# Patient Record
Sex: Female | Born: 1995 | Race: White | Hispanic: No | Marital: Married | Smoking: Never smoker
Health system: Southern US, Community
[De-identification: ages and names within clinical notes are randomized; demographics above are authoritative.]

## PROBLEM LIST (undated history)

## (undated) ENCOUNTER — Inpatient Hospital Stay (HOSPITAL_COMMUNITY): Payer: Self-pay

## (undated) DIAGNOSIS — K920 Hematemesis: Secondary | ICD-10-CM

## (undated) DIAGNOSIS — O210 Mild hyperemesis gravidarum: Secondary | ICD-10-CM

## (undated) DIAGNOSIS — J069 Acute upper respiratory infection, unspecified: Secondary | ICD-10-CM

## (undated) DIAGNOSIS — R0981 Nasal congestion: Secondary | ICD-10-CM

## (undated) DIAGNOSIS — O99891 Other specified diseases and conditions complicating pregnancy: Secondary | ICD-10-CM

## (undated) DIAGNOSIS — R000 Tachycardia, unspecified: Secondary | ICD-10-CM

## (undated) DIAGNOSIS — O99210 Obesity complicating pregnancy, unspecified trimester: Secondary | ICD-10-CM

## (undated) DIAGNOSIS — J039 Acute tonsillitis, unspecified: Secondary | ICD-10-CM

## (undated) DIAGNOSIS — E282 Polycystic ovarian syndrome: Secondary | ICD-10-CM

## (undated) DIAGNOSIS — K219 Gastro-esophageal reflux disease without esophagitis: Secondary | ICD-10-CM

## (undated) DIAGNOSIS — R42 Dizziness and giddiness: Secondary | ICD-10-CM

## (undated) DIAGNOSIS — R7989 Other specified abnormal findings of blood chemistry: Secondary | ICD-10-CM

## (undated) DIAGNOSIS — O9989 Other specified diseases and conditions complicating pregnancy, childbirth and the puerperium: Secondary | ICD-10-CM

## (undated) HISTORY — DX: Mild hyperemesis gravidarum: O21.0

## (undated) HISTORY — DX: Hematemesis: K92.0

## (undated) HISTORY — DX: Other specified diseases and conditions complicating pregnancy, childbirth and the puerperium: O99.89

## (undated) HISTORY — DX: Other specified diseases and conditions complicating pregnancy: O99.891

## (undated) HISTORY — DX: Gastro-esophageal reflux disease without esophagitis: K21.9

## (undated) HISTORY — DX: Polycystic ovarian syndrome: E28.2

## (undated) HISTORY — DX: Other specified abnormal findings of blood chemistry: R79.89

## (undated) HISTORY — DX: Tachycardia, unspecified: R00.0

## (undated) HISTORY — DX: Dizziness and giddiness: R42

## (undated) HISTORY — DX: Acute upper respiratory infection, unspecified: J06.9

## (undated) HISTORY — DX: Nasal congestion: R09.81

## (undated) HISTORY — DX: Obesity complicating pregnancy, unspecified trimester: O99.210

---
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
## 2016-03-04 DIAGNOSIS — E282 Polycystic ovarian syndrome: Secondary | ICD-10-CM | POA: Insufficient documentation

## 2016-03-19 ENCOUNTER — Encounter (HOSPITAL_BASED_OUTPATIENT_CLINIC_OR_DEPARTMENT_OTHER): Payer: Self-pay

## 2016-03-19 ENCOUNTER — Emergency Department (HOSPITAL_BASED_OUTPATIENT_CLINIC_OR_DEPARTMENT_OTHER)
Admission: EM | Admit: 2016-03-19 | Discharge: 2016-03-19 | Disposition: A | Discharge status: Home / Self Care | Payer: BLUE CROSS/BLUE SHIELD | Attending: Emergency Medicine | Admitting: Emergency Medicine

## 2016-03-19 ENCOUNTER — Emergency Department (HOSPITAL_BASED_OUTPATIENT_CLINIC_OR_DEPARTMENT_OTHER): Payer: BLUE CROSS/BLUE SHIELD

## 2016-03-19 DIAGNOSIS — Y939 Activity, unspecified: Secondary | ICD-10-CM | POA: Insufficient documentation

## 2016-03-19 DIAGNOSIS — Z7984 Long term (current) use of oral hypoglycemic drugs: Secondary | ICD-10-CM | POA: Diagnosis not present

## 2016-03-19 DIAGNOSIS — Y999 Unspecified external cause status: Secondary | ICD-10-CM | POA: Diagnosis not present

## 2016-03-19 DIAGNOSIS — S0990XA Unspecified injury of head, initial encounter: Secondary | ICD-10-CM | POA: Diagnosis present

## 2016-03-19 DIAGNOSIS — Z79899 Other long term (current) drug therapy: Secondary | ICD-10-CM | POA: Diagnosis not present

## 2016-03-19 DIAGNOSIS — W108XXA Fall (on) (from) other stairs and steps, initial encounter: Secondary | ICD-10-CM | POA: Insufficient documentation

## 2016-03-19 DIAGNOSIS — Y929 Unspecified place or not applicable: Secondary | ICD-10-CM | POA: Insufficient documentation

## 2016-03-19 MED ORDER — IBUPROFEN 400 MG PO TABS
600.0000 mg | ORAL_TABLET | Freq: Once | ORAL | Status: AC
  Administered 2016-03-19: 600 mg via ORAL
  Filled 2016-03-19: qty 1

## 2016-03-19 MED ORDER — METOCLOPRAMIDE HCL 10 MG PO TABS
10.0000 mg | ORAL_TABLET | Freq: Once | ORAL | Status: AC
  Administered 2016-03-19: 10 mg via ORAL
  Filled 2016-03-19: qty 1

## 2016-03-19 MED ORDER — IBUPROFEN 600 MG PO TABS: 600.0000 mg | ORAL_TABLET | Freq: Four times a day (QID) | ORAL | 0 refills | Status: DC | PRN

## 2016-03-19 MED ORDER — METOCLOPRAMIDE HCL 10 MG PO TABS: 10.0000 mg | ORAL_TABLET | Freq: Four times a day (QID) | ORAL | 0 refills | Status: DC | PRN

## 2016-03-19 NOTE — ED Notes (Signed)
Pt states fell down steps and had LOC, fell today at approx 2pm. Pt c/o lower back pain and has severe HA

## 2016-03-19 NOTE — ED Notes (Signed)
Pt states she has dizziness and HA, gait noted to unsteady when pt ambulated to examination room

## 2016-03-19 NOTE — ED Triage Notes (Addendum)
Pt states she fell down approx 6 carpeted steps 40 min PTA-c/o pain to "entire head" and lower back-unwitnessed-reports 1-2 min LOC-NAD-steady gait-denies neck pain/no pint tenderness-no break in skin

## 2016-03-19 NOTE — ED Provider Notes (Signed)
[facility] Provider Note   CSN: [id number] Arrival date & time: 03/19/16    By signing my name below, I, Modena Jansky, attest that this documentation has been prepared under the direction and in the presence of Loren Racer, MD. Electronically Signed: Modena Jansky, Scribe. 03/19/2016. 7:00 PM.  History   Chief Complaint Chief Complaint  Patient presents with  . Head Injury   The history is provided by the patient. No language interpreter was used.   HPI Comments: Amanda Hicks is a 21 y.o. female who presents to the Emergency Department complaining of a fall that occurred about 5 hours ago. She states she fell backwards downSeveral carpeted stairs, but cannot recall entire episode (unwitnessed). She is unsure of LOC or head injury. She reports associated constant moderate generalized headache. She denies any hx of concussion, lightheadedness, nausea, or other complaints. Complains of nausea and photophobia. No neck pain, weakness or numbness.    PCP: Primus Bravo, NP  Past Medical History:  Diagnosis Date  . Tonsillitis 07/2012    There are no active problems to display for this patient.   Past Surgical History:  Procedure Laterality Date  . OOPHORECTOMY Left 02/2012  . TONSILLECTOMY N/A 08/24/2012   Procedure: TONSILLECTOMY and ADENOIDECTOMY;  Surgeon: Drema Halon, MD;  Location: [facility];  Service: ENT;  Laterality: N/A;    OB History    No data available       Home Medications    Prior to Admission medications   Medication Sig Start Date End Date Taking? Authorizing Provider  METFORMIN HCL PO Take by mouth.   Yes Historical Provider, MD  TRAZODONE HCL PO Take by mouth.   Yes Historical Provider, MD  ibuprofen (ADVIL,MOTRIN) 600 MG tablet Take 1 tablet (600 mg total) by mouth every 6 (six) hours as needed. 03/19/16   Loren Racer, MD  metoCLOPramide (REGLAN) 10 MG tablet Take 1 tablet (10 mg total) by mouth every 6  (six) hours as needed for nausea (headache). 03/19/16   Loren Racer, MD    Family History No family history on file.  Social History Social History  Substance Use Topics  . Smoking status: Never Smoker  . Smokeless tobacco: Never Used  . Alcohol use No     Allergies   Patient has no known allergies.   Review of Systems Review of Systems  Constitutional: Negative for chills and fever.  HENT: Negative for facial swelling.   Eyes: Positive for photophobia. Negative for visual disturbance.  Respiratory: Negative for shortness of breath.   Cardiovascular: Negative for chest pain.  Gastrointestinal: Positive for nausea. Negative for anal bleeding, constipation, diarrhea and vomiting.  Musculoskeletal: Negative for back pain, myalgias, neck pain and neck stiffness.  Skin: Negative for wound.  Neurological: Positive for syncope and headaches. Negative for dizziness, weakness and numbness.  All other systems reviewed and are negative.    Physical Exam Updated Vital Signs BP 143/75   Pulse 67   Temp 98.2 F (36.8 C)   Resp 16   Ht 5\' 4"  (1.626 m)   Wt 256 lb (116.1 kg)   LMP 03/18/2016   SpO2 100%   BMI 43.94 kg/m   Physical Exam  Constitutional: She is oriented to person, place, and time. She appears well-developed and well-nourished.  HENT:  Head: Normocephalic and atraumatic.  Mouth/Throat: Oropharynx is clear and moist.  No evidence of any head trauma. No hemotympanum. No scalp hematoma.  Eyes: EOM are normal. Pupils are  equal, round, and reactive to light.  Neck: Normal range of motion. Neck supple.  No posterior midline cervical tenderness to palpation.  Cardiovascular: Normal rate and regular rhythm.   Pulmonary/Chest: Effort normal and breath sounds normal.  Abdominal: Soft. Bowel sounds are normal. There is no tenderness. There is no rebound and no guarding.  Musculoskeletal: Normal range of motion. She exhibits no edema or tenderness.  No midline  thoracic or lumbar tenderness.  Neurological: She is alert and oriented to person, place, and time.  Patient is alert and oriented x3 with clear, goal oriented speech. Patient has 5/5 motor in all extremities. Sensation is intact to light touch. Bilateral finger-to-nose is normal with no signs of dysmetria. Patient has a normal gait and walks without assistance.  Skin: Skin is warm and dry. No rash noted. No erythema.  Psychiatric: She has a normal mood and affect. Her behavior is normal.  Nursing note and vitals reviewed.    ED Treatments / Results  DIAGNOSTIC STUDIES: Oxygen Saturation is 100% on RA, normal by my interpretation.    COORDINATION OF CARE: 7:04 PM- Pt advised of plan for treatment and pt agrees.  Labs (all labs ordered are listed, but only abnormal results are displayed) Labs Reviewed - No data to display  EKG  EKG Interpretation None       Radiology Ct Head Wo Contrast  Result Date: 03/19/2016 CLINICAL DATA:  Recent fall with posterior injury and possible loss of consciousness EXAM: CT HEAD WITHOUT CONTRAST TECHNIQUE: Contiguous axial images were obtained from the base of the skull through the vertex without intravenous contrast. COMPARISON:  None. FINDINGS: Brain: No evidence of acute infarction, hemorrhage, hydrocephalus, extra-axial collection or mass lesion/mass effect. Vascular: No hyperdense vessel or unexpected calcification. Skull: Normal. Negative for fracture or focal lesion. Sinuses/Orbits: No acute finding. Other: None. IMPRESSION: No acute intracranial abnormality noted. Electronically Signed   By: Alcide CleverMark  Lukens M.D.   On: 03/19/2016 15:11    Procedures Procedures (including critical care time)  Medications Ordered in ED Medications  ibuprofen (ADVIL,MOTRIN) tablet 600 mg (600 mg Oral Given 03/19/16 1923)  metoCLOPramide (REGLAN) tablet 10 mg (10 mg Oral Given 03/19/16 1923)     Initial Impression / Assessment and Plan / ED Course  I have  reviewed the triage vital signs and the nursing notes.  Pertinent labs & imaging results that were available during my care of the patient were reviewed by me and considered in my medical decision making (see chart for details).     Patient is very well-appearing. CT without any intracranial abnormality. Will treat for possible concussion and have follow-up with her primary physician. Return precautions given.  Final Clinical Impressions(s) / ED Diagnoses   Final diagnoses:  Closed head injury, initial encounter    New Prescriptions New Prescriptions   IBUPROFEN (ADVIL,MOTRIN) 600 MG TABLET    Take 1 tablet (600 mg total) by mouth every 6 (six) hours as needed.   METOCLOPRAMIDE (REGLAN) 10 MG TABLET    Take 1 tablet (10 mg total) by mouth every 6 (six) hours as needed for nausea (headache).   I personally performed the services described in this documentation, which was scribed in my presence. The recorded information has been reviewed and is accurate.       Loren Raceravid Fredia Chittenden, MD 03/20/16 [phone]

## 2016-03-19 NOTE — ED Notes (Signed)
Points to area of lower back, rt of spine area, no abrasions or bruising noted

## 2016-12-24 LAB — OB RESULTS CONSOLE GC/CHLAMYDIA
CHLAMYDIA, DNA PROBE: NEGATIVE
GC PROBE AMP, GENITAL: NEGATIVE

## 2016-12-24 LAB — OB RESULTS CONSOLE HIV ANTIBODY (ROUTINE TESTING): HIV: NONREACTIVE

## 2016-12-24 LAB — OB RESULTS CONSOLE ABO/RH: RH Type: POSITIVE

## 2016-12-24 LAB — OB RESULTS CONSOLE RPR: RPR: NONREACTIVE

## 2016-12-24 LAB — OB RESULTS CONSOLE HEPATITIS B SURFACE ANTIGEN: Hepatitis B Surface Ag: NEGATIVE

## 2016-12-24 LAB — OB RESULTS CONSOLE ANTIBODY SCREEN: Antibody Screen: NEGATIVE

## 2017-01-01 DIAGNOSIS — N390 Urinary tract infection, site not specified: Secondary | ICD-10-CM | POA: Diagnosis not present

## 2017-01-01 DIAGNOSIS — Z90721 Acquired absence of ovaries, unilateral: Secondary | ICD-10-CM | POA: Diagnosis not present

## 2017-01-01 DIAGNOSIS — R42 Dizziness and giddiness: Secondary | ICD-10-CM | POA: Diagnosis not present

## 2017-01-01 DIAGNOSIS — R112 Nausea with vomiting, unspecified: Secondary | ICD-10-CM | POA: Diagnosis not present

## 2017-01-01 DIAGNOSIS — E860 Dehydration: Secondary | ICD-10-CM | POA: Diagnosis not present

## 2017-01-01 DIAGNOSIS — O26891 Other specified pregnancy related conditions, first trimester: Secondary | ICD-10-CM | POA: Diagnosis not present

## 2017-01-01 DIAGNOSIS — O99281 Endocrine, nutritional and metabolic diseases complicating pregnancy, first trimester: Secondary | ICD-10-CM | POA: Diagnosis not present

## 2017-01-01 DIAGNOSIS — Z3A11 11 weeks gestation of pregnancy: Secondary | ICD-10-CM | POA: Diagnosis not present

## 2017-01-01 DIAGNOSIS — O2341 Unspecified infection of urinary tract in pregnancy, first trimester: Secondary | ICD-10-CM | POA: Diagnosis not present

## 2017-01-01 DIAGNOSIS — O211 Hyperemesis gravidarum with metabolic disturbance: Secondary | ICD-10-CM | POA: Diagnosis not present

## 2017-01-07 DIAGNOSIS — Z3401 Encounter for supervision of normal first pregnancy, first trimester: Secondary | ICD-10-CM | POA: Diagnosis not present

## 2017-01-20 DIAGNOSIS — Z3A14 14 weeks gestation of pregnancy: Secondary | ICD-10-CM | POA: Diagnosis not present

## 2017-01-20 DIAGNOSIS — Z3402 Encounter for supervision of normal first pregnancy, second trimester: Secondary | ICD-10-CM | POA: Diagnosis not present

## 2017-01-20 DIAGNOSIS — O210 Mild hyperemesis gravidarum: Secondary | ICD-10-CM | POA: Diagnosis not present

## 2017-02-03 DIAGNOSIS — R509 Fever, unspecified: Secondary | ICD-10-CM | POA: Diagnosis not present

## 2017-02-03 DIAGNOSIS — R05 Cough: Secondary | ICD-10-CM | POA: Diagnosis not present

## 2017-02-03 DIAGNOSIS — E669 Obesity, unspecified: Secondary | ICD-10-CM | POA: Diagnosis not present

## 2017-02-03 DIAGNOSIS — R0602 Shortness of breath: Secondary | ICD-10-CM | POA: Diagnosis not present

## 2017-02-03 DIAGNOSIS — J069 Acute upper respiratory infection, unspecified: Secondary | ICD-10-CM | POA: Diagnosis not present

## 2017-02-03 DIAGNOSIS — M791 Myalgia, unspecified site: Secondary | ICD-10-CM | POA: Diagnosis not present

## 2017-02-10 DIAGNOSIS — J069 Acute upper respiratory infection, unspecified: Secondary | ICD-10-CM | POA: Diagnosis not present

## 2017-02-10 DIAGNOSIS — K219 Gastro-esophageal reflux disease without esophagitis: Secondary | ICD-10-CM | POA: Diagnosis not present

## 2017-02-10 DIAGNOSIS — Z3402 Encounter for supervision of normal first pregnancy, second trimester: Secondary | ICD-10-CM | POA: Diagnosis not present

## 2017-02-24 NOTE — L&D Delivery Note (Signed)
Delivery Note At 2:35 PM a viable female was delivered via Vaginal, Spontaneous (Presentation: DOA;  ).  APGAR: 8, 9; weight pending .   Placenta status: spontaneous but removed manually from cervix/ upper vagina , .  Cord: tore but not avulsed during placental delivery with the following complications: Terminal bradycardia, NICU in attendence.  Slow coming head and prepared for possible shoulder dystocia but no dystocia. Increase in PP bleeding and cytotec given .  Cord pH: not indicated due to spontaneous cry.   Anesthesia:  epidrual Episiotomy: None Lacerations: 2nd degree;Perineal Suture Repair: 2.0 3.0 vicryl rapide Est. Blood Loss (mL): 600  Mom to postpartum.  Baby to Couplet care / Skin to Skin.  Lendon Colonel 07/14/2017, 3:38 PM

## 2017-02-26 DIAGNOSIS — Z361 Encounter for antenatal screening for raised alphafetoprotein level: Secondary | ICD-10-CM | POA: Diagnosis not present

## 2017-02-26 DIAGNOSIS — Z363 Encounter for antenatal screening for malformations: Secondary | ICD-10-CM | POA: Diagnosis not present

## 2017-02-26 DIAGNOSIS — Z3402 Encounter for supervision of normal first pregnancy, second trimester: Secondary | ICD-10-CM | POA: Diagnosis not present

## 2017-02-26 DIAGNOSIS — R42 Dizziness and giddiness: Secondary | ICD-10-CM | POA: Diagnosis not present

## 2017-02-26 DIAGNOSIS — R7989 Other specified abnormal findings of blood chemistry: Secondary | ICD-10-CM | POA: Diagnosis not present

## 2017-03-18 DIAGNOSIS — Z3402 Encounter for supervision of normal first pregnancy, second trimester: Secondary | ICD-10-CM | POA: Diagnosis not present

## 2017-03-25 DIAGNOSIS — Z3402 Encounter for supervision of normal first pregnancy, second trimester: Secondary | ICD-10-CM | POA: Diagnosis not present

## 2017-03-31 ENCOUNTER — Inpatient Hospital Stay (HOSPITAL_COMMUNITY)
Admission: AD | Admit: 2017-03-31 | Discharge: 2017-03-31 | Disposition: A | Discharge status: Home / Self Care | Payer: BLUE CROSS/BLUE SHIELD | Source: Ambulatory Visit | Attending: Obstetrics and Gynecology | Admitting: Obstetrics and Gynecology

## 2017-03-31 ENCOUNTER — Encounter (HOSPITAL_COMMUNITY): Payer: Self-pay

## 2017-03-31 DIAGNOSIS — O26892 Other specified pregnancy related conditions, second trimester: Secondary | ICD-10-CM | POA: Diagnosis not present

## 2017-03-31 DIAGNOSIS — N949 Unspecified condition associated with female genital organs and menstrual cycle: Secondary | ICD-10-CM

## 2017-03-31 DIAGNOSIS — R1011 Right upper quadrant pain: Secondary | ICD-10-CM | POA: Diagnosis not present

## 2017-03-31 DIAGNOSIS — R1031 Right lower quadrant pain: Secondary | ICD-10-CM | POA: Diagnosis not present

## 2017-03-31 DIAGNOSIS — E282 Polycystic ovarian syndrome: Secondary | ICD-10-CM

## 2017-03-31 DIAGNOSIS — Z3A24 24 weeks gestation of pregnancy: Secondary | ICD-10-CM | POA: Diagnosis not present

## 2017-03-31 HISTORY — DX: Polycystic ovarian syndrome: E28.2

## 2017-03-31 LAB — URINALYSIS, ROUTINE W REFLEX MICROSCOPIC
BILIRUBIN URINE: NEGATIVE
Glucose, UA: 50 mg/dL — AB
HGB URINE DIPSTICK: NEGATIVE
Ketones, ur: NEGATIVE mg/dL
Nitrite: NEGATIVE
Protein, ur: NEGATIVE mg/dL
RBC / HPF: NONE SEEN RBC/hpf (ref 0–5)
Specific Gravity, Urine: 1.009 (ref 1.005–1.030)
Trans Epithel, UA: <1
pH: 6 (ref 5.0–8.0)

## 2017-03-31 NOTE — Discharge Instructions (Signed)
Heat to area, tylenol prn pain

## 2017-03-31 NOTE — MAU Note (Signed)
Lower right sided abdominal pain in ovary.  States pain has been going on for a few days-became sharp tonight. Has a history of PCOS and left ovarian torsion. States the pain is pulsing.  Comes and goes but is becoming constant.  Took tylenol tonight without relief.

## 2017-03-31 NOTE — MAU Provider Note (Signed)
History     Chief Complaint  Patient presents with  . Abdominal Pain   22 yo G1P0 morbidly obese gravid white female @ 24 2/[redacted] weeks gestation presents with c/o right lower quadrant pain which has been present for several day and became sharp tonight. Hx left oophorectomy. Denies n/v/f (+) FM No urinary sx or vaginal bleeding. Pain was nonradiating  OB History    Gravida Para Term Preterm AB Living   1             SAB TAB Ectopic Multiple Live Births                  Past Medical History:  Diagnosis Date  . PCOS (polycystic ovarian syndrome)   . Tonsillitis 07/2012    Past Surgical History:  Procedure Laterality Date  . OOPHORECTOMY Left 02/2012  . TONSILLECTOMY N/A 08/24/2012   Procedure: TONSILLECTOMY and ADENOIDECTOMY;  Surgeon: Drema Halonhristopher E Newman, MD;  Location: [facility];  Service: ENT;  Laterality: N/A;    No family history on file.  Social History   Tobacco Use  . Smoking status: Never Smoker  . Smokeless tobacco: Never Used  Substance Use Topics  . Alcohol use: No  . Drug use: No    Allergies: No Known Allergies  Medications Prior to Admission  Medication Sig Dispense Refill Last Dose  . acetaminophen (TYLENOL) 500 MG tablet Take 1,000 mg by mouth every 6 (six) hours as needed for moderate pain.   03/31/2017 at Unknown time  . ibuprofen (ADVIL,MOTRIN) 600 MG tablet Take 1 tablet (600 mg total) by mouth every 6 (six) hours as needed. 30 tablet 0   . METFORMIN HCL PO Take by mouth.     . metoCLOPramide (REGLAN) 10 MG tablet Take 1 tablet (10 mg total) by mouth every 6 (six) hours as needed for nausea (headache). 30 tablet 0   . TRAZODONE HCL PO Take by mouth.        Physical Exam   Blood pressure 118/76, pulse (!) 104, temperature 98.6 F (37 C), resp. rate 19, height 5\' 4"  (1.626 m), weight 118.9 kg (262 lb 0.6 oz), SpO2 100 %.  General appearance: alert, cooperative, no distress and morbidly obese Abdomen: gravid soft nontender Pelvic:  deferred  Tracing> baseline 140 good variability small accel no ctx ED Course  IMP: Round ligament pain IUP@ 24 2/7 weeks P) d/c home. May use tylenol/warm heat to area MDM   Serita KyleSheronette A Hodges Treiber, MD 3:09 AM 03/31/2017

## 2017-04-16 DIAGNOSIS — R7989 Other specified abnormal findings of blood chemistry: Secondary | ICD-10-CM | POA: Diagnosis not present

## 2017-04-16 DIAGNOSIS — Z3689 Encounter for other specified antenatal screening: Secondary | ICD-10-CM | POA: Diagnosis not present

## 2017-04-16 DIAGNOSIS — Z3402 Encounter for supervision of normal first pregnancy, second trimester: Secondary | ICD-10-CM | POA: Diagnosis not present

## 2017-05-06 DIAGNOSIS — Z23 Encounter for immunization: Secondary | ICD-10-CM | POA: Diagnosis not present

## 2017-05-06 DIAGNOSIS — Z3402 Encounter for supervision of normal first pregnancy, second trimester: Secondary | ICD-10-CM | POA: Diagnosis not present

## 2017-05-06 DIAGNOSIS — Z3689 Encounter for other specified antenatal screening: Secondary | ICD-10-CM | POA: Diagnosis not present

## 2017-05-19 DIAGNOSIS — R000 Tachycardia, unspecified: Secondary | ICD-10-CM | POA: Diagnosis not present

## 2017-05-19 DIAGNOSIS — Z3402 Encounter for supervision of normal first pregnancy, second trimester: Secondary | ICD-10-CM | POA: Diagnosis not present

## 2017-05-21 ENCOUNTER — Telehealth: Payer: Self-pay

## 2017-05-21 NOTE — Telephone Encounter (Signed)
SENT REFERRAL TO SCHEDULING 

## 2017-05-21 NOTE — Telephone Encounter (Signed)
Referral sent to scheduling. 

## 2017-05-21 NOTE — Telephone Encounter (Signed)
Error

## 2017-05-22 ENCOUNTER — Encounter: Payer: Self-pay | Admitting: *Deleted

## 2017-05-22 ENCOUNTER — Other Ambulatory Visit: Payer: Self-pay | Admitting: *Deleted

## 2017-05-22 DIAGNOSIS — F988 Other specified behavioral and emotional disorders with onset usually occurring in childhood and adolescence: Secondary | ICD-10-CM | POA: Insufficient documentation

## 2017-06-02 ENCOUNTER — Encounter: Payer: Self-pay | Admitting: Interventional Cardiology

## 2017-06-02 DIAGNOSIS — Z3402 Encounter for supervision of normal first pregnancy, second trimester: Secondary | ICD-10-CM | POA: Diagnosis not present

## 2017-06-04 ENCOUNTER — Encounter (INDEPENDENT_AMBULATORY_CARE_PROVIDER_SITE_OTHER): Payer: Self-pay

## 2017-06-04 ENCOUNTER — Ambulatory Visit (INDEPENDENT_AMBULATORY_CARE_PROVIDER_SITE_OTHER): Payer: BLUE CROSS/BLUE SHIELD | Admitting: Interventional Cardiology

## 2017-06-04 ENCOUNTER — Encounter: Payer: Self-pay | Admitting: Interventional Cardiology

## 2017-06-04 VITALS — BP 122/70 | HR 105 | Ht 64.0 in | Wt 274.0 lb

## 2017-06-04 DIAGNOSIS — R002 Palpitations: Secondary | ICD-10-CM | POA: Diagnosis not present

## 2017-06-04 DIAGNOSIS — R7989 Other specified abnormal findings of blood chemistry: Secondary | ICD-10-CM | POA: Diagnosis not present

## 2017-06-04 DIAGNOSIS — Z6841 Body Mass Index (BMI) 40.0 and over, adult: Secondary | ICD-10-CM | POA: Diagnosis not present

## 2017-06-04 NOTE — Progress Notes (Signed)
Cardiology Office Note   Date:  06/04/2017   ID:  Amanda Hicks Rise, DOB 1995-05-08, MRN [id number]  PCP:  Lindley MagnusLong, Ashley, PA-Hicks    No chief complaint on file.  palpitations  Wt Readings from Last 3 Encounters:  06/04/17 274 lb (124.3 kg)  03/31/17 262 lb 0.6 oz (118.9 kg)  03/19/16 256 lb (116.1 kg)       History of Present Illness: Amanda Hicks Gervin is a 22 y.o. female who is being seen today for the evaluation of palpitations at the request of Noland FordyceFogleman, Kelly, MD.  She has noted that her HR can go up and down while sitting down.  No stress or any other trigger identified.  It can last 5-30 minutes.  She can feel lightheaded.  It is sudden.    HR up to 150 at church a few weeks ago.  It lasted 30 minutes, with lightheadedness.  HR was sustained while she was sitting down.  SHe felt weak.    She had another episode like this where she was at work.  She works at a daycare.  Labs done with OB/GYN.  Hbg 11.4.  TSH was low at 0.1.  She has not had cardiac testing in the past.  Denies : Chest pain. Leg edema. Nitroglycerin use. Orthopnea. Paroxysmal nocturnal dyspnea. Shortness of breath. Syncope.  She does well walking.   Past Medical History:  Diagnosis Date  . Dizziness   . GERD (gastroesophageal reflux disease)   . Hyperemesis affecting pregnancy, antepartum   . Low TSH level   . Nasal congestion related to pregnancy   . Obesity in pregnancy   . PCOS (polycystic ovarian syndrome)   . Polycystic ovaries   . Tachycardia   . Tonsillitis 07/2012  . URI, acute   . Vomiting blood     Past Surgical History:  Procedure Laterality Date  . OOPHORECTOMY Left 02/2012  . TONSILLECTOMY N/A 08/24/2012   Procedure: TONSILLECTOMY and ADENOIDECTOMY;  Surgeon: Drema Halonhristopher E Newman, MD;  Location: [facility];  Service: ENT;  Laterality: N/A;     Current Outpatient Medications  Medication Sig Dispense Refill  . acetaminophen (TYLENOL) 500 MG tablet Take 1,000 mg  by mouth every 6 (six) hours as needed for moderate pain.    . Prenatal Vit-Fe Fumarate-FA (MULTIVITAMIN-PRENATAL) 27-0.8 MG TABS tablet Take 1 tablet by mouth daily at 12 noon.     No current facility-administered medications for this visit.     Allergies:   Patient has no known allergies.    Social History:  The patient  reports that she has never smoked. She has never used smokeless tobacco. She reports that she does not drink alcohol or use drugs.   Family History:  The patient's family history includes Diabetes Mellitus I in her father; Hypertension in her mother.    ROS:  Please see the history of present illness.   Otherwise, review of systems are positive for palpitations.   All other systems are reviewed and negative.    PHYSICAL EXAM: VS:  BP 122/70 (BP Location: Right Arm, Patient Position: Sitting, Cuff Size: Large)   Pulse (!) 105   Ht 5\' 4"  (1.626 m)   Wt 274 lb (124.3 kg)   SpO2 99%   BMI 47.03 kg/m  , BMI Body mass index is 47.03 kg/m. GEN: Well nourished, well developed, in no acute distress  HEENT: normal  Neck: no JVD, carotid bruits, or masses Cardiac: tachycardic, regular rate; no murmurs, rubs,  or gallops,no edema  Respiratory:  clear to auscultation bilaterally, normal work of breathing GI: soft, nontender, nondistended, + BS MS: no deformity or atrophy  Skin: warm and dry, no rash Neuro:  Strength and sensation are intact Psych: euthymic mood, full affect   EKG:   The ekg ordered today demonstrates sinus tachycardia, HR 105, no ST changes   Recent Labs: No results found for requested labs within last 8760 hours.   Lipid Panel No results found for: CHOL, TRIG, HDL, CHOLHDL, VLDL, LDLCALC, LDLDIRECT   Other studies Reviewed: Additional studies/ records that were reviewed today with results demonstrating: .   ASSESSMENT AND PLAN:  1. Palpitations: sinus tach on the ECG.  May be related to hyprethyroidism.  TSH was low on blood test from  OB/GYN in 4/19.  I have asked her to follow-up with her primary care doctor to get further thyroid testing.  This could be contributing to palpitations and fast heart rate.  We will not start any medication at this time.  We will plan for 30-day event monitor to see if there is any other irregular heart rhythm occurring.  No evidence of structural heart disease based on exam. 2. If she is found to be hyperthyroid after free T4 is checked, could use beta-blocker to help with palpitations. 3. Obesity: Longterm, would benefit from weight loss.  Thyroid issues can have an impact on weight as well.    Current medicines are reviewed at length with the patient today.  The patient concerns regarding her medicines were addressed.  The following changes have been made:  No change  Labs/ tests ordered today include:  No orders of the defined types were placed in this encounter.   Recommend 150 minutes/week of aerobic exercise Low fat, low carb, high fiber diet recommended  Disposition:   FU for 30 day event monitor; f/u based on results.   Signed, Lance Muss, MD  06/04/2017 9:07 AM    [facility] [address] [place], [place], [place]  [address] Phone: [phone]; Fax: [phone]

## 2017-06-04 NOTE — Patient Instructions (Signed)
Medication Instructions:  Your physician recommends that you continue on your current medications as directed. Please refer to the Current Medication list given to you today.   Labwork: None ordered  Testing/Procedures: Your physician has recommended that you wear an event monitor. Event monitors are medical devices that record the heart's electrical activity. Doctors most often us these monitors to diagnose arrhythmias. Arrhythmias are problems with the speed or rhythm of the heartbeat. The monitor is a small, portable device. You can wear one while you do your normal daily activities. This is usually used to diagnose what is causing palpitations/syncope (passing out).  Follow-Up: Based on test results  Any Other Special Instructions Will Be Listed Below (If Applicable).   Cardiac Event Monitoring A cardiac event monitor is a small recording device that is used to detect abnormal heart rhythms (arrhythmias). The monitor is used to record your heart rhythm when you have symptoms, such as:  Fast heartbeats (palpitations), such as heart racing or fluttering.  Dizziness.  Fainting or light-headedness.  Unexplained weakness.  Some monitors are wired to electrodes placed on your chest. Electrodes are flat, sticky disks that attach to your skin. Other monitors may be hand-held or worn on the wrist. The monitor can be worn for up to 30 days. If the monitor is attached to your chest, a technician will prepare your chest for the electrode placement and show you how to work the monitor. Take time to practice using the monitor before you leave the office. Make sure you understand how to send the information from the monitor to your health care provider. In some cases, you may need to use a landline telephone instead of a cell phone. What are the risks? Generally, this device is safe to use, but it possible that the skin under the electrodes will become irritated. How to use your cardiac event  monitor  Wear your monitor at all times, except when you are in water: ? Do not let the monitor get wet. ? Take the monitor off when you bathe. Do not swim or use a hot tub with it on.  Keep your skin clean. Do not put body lotion or moisturizer on your chest.  Change the electrodes as told by your health care provider or any time they stop sticking to your skin. You may need to use medical tape to keep them on.  Try to put the electrodes in slightly different places on your chest to help prevent skin irritation. They must remain in the area under your left breast and in the upper right section of your chest.  Make sure the monitor is safely clipped to your clothing or in a location close to your body that your health care provider recommends.  Press the button to record as soon as you feel heart-related symptoms, such as: ? Dizziness. ? Weakness. ? Light-headedness. ? Palpitations. ? Thumping or pounding in your chest. ? Shortness of breath. ? Unexplained weakness.  Keep a diary of your activities, such as walking, doing chores, and taking medicine. It is very important to note what you were doing when you pushed the button to record your symptoms. This will help your health care provider determine what might be contributing to your symptoms.  Send the recorded information as recommended by your health care provider. It may take some time for your health care provider to process the results.  Change the batteries as told by your health care provider.  Keep electronic devices away from your monitor.   This includes: ? Tablets. ? MP3 players. ? Cell phones.  While wearing your monitor you should avoid: ? Electric blankets. ? Electric razors. ? Electric toothbrushes. ? Microwave ovens. ? Magnets. ? Metal detectors. Get help right away if:  You have chest pain.  You have extreme difficulty breathing or shortness of breath.  You develop a very fast heartbeat that  persists.  You develop dizziness that does not go away.  You faint or constantly feel like you are about to faint. Summary  A cardiac event monitor is a small recording device that is used to help detect abnormal heart rhythms (arrhythmias).  The monitor is used to record your heart rhythm when you have heart-related symptoms.  Make sure you understand how to send the information from the monitor to your health care provider.  It is important to press the button on the monitor when you have any heart-related symptoms.  Keep a diary of your activities, such as walking, doing chores, and taking medicine. It is very important to note what you were doing when you pushed the button to record your symptoms. This will help your health care provider learn what might be causing your symptoms. This information is not intended to replace advice given to you by your health care provider. Make sure you discuss any questions you have with your health care provider. Document Released: 11/20/2007 Document Revised: 01/26/2016 Document Reviewed: 01/26/2016 Elsevier Interactive Patient Education  2017 Elsevier Inc.    If you need a refill on your cardiac medications before your next appointment, please call your pharmacy.   

## 2017-06-15 DIAGNOSIS — Z3685 Encounter for antenatal screening for Streptococcus B: Secondary | ICD-10-CM | POA: Diagnosis not present

## 2017-06-15 DIAGNOSIS — Z3A35 35 weeks gestation of pregnancy: Secondary | ICD-10-CM | POA: Diagnosis not present

## 2017-06-15 DIAGNOSIS — O479 False labor, unspecified: Secondary | ICD-10-CM | POA: Diagnosis not present

## 2017-06-15 LAB — OB RESULTS CONSOLE GBS: GBS: NEGATIVE

## 2017-06-16 ENCOUNTER — Encounter (HOSPITAL_COMMUNITY): Payer: Self-pay

## 2017-06-16 ENCOUNTER — Other Ambulatory Visit: Payer: Self-pay

## 2017-06-16 ENCOUNTER — Ambulatory Visit (INDEPENDENT_AMBULATORY_CARE_PROVIDER_SITE_OTHER): Payer: BLUE CROSS/BLUE SHIELD

## 2017-06-16 ENCOUNTER — Inpatient Hospital Stay (HOSPITAL_COMMUNITY)
Admission: AD | Admit: 2017-06-16 | Discharge: 2017-06-17 | DRG: 831 | Disposition: A | Discharge status: Home / Self Care | Payer: BLUE CROSS/BLUE SHIELD | Source: Ambulatory Visit | Attending: Obstetrics & Gynecology | Admitting: Obstetrics & Gynecology

## 2017-06-16 DIAGNOSIS — O133 Gestational [pregnancy-induced] hypertension without significant proteinuria, third trimester: Secondary | ICD-10-CM | POA: Diagnosis not present

## 2017-06-16 DIAGNOSIS — Z3A35 35 weeks gestation of pregnancy: Secondary | ICD-10-CM | POA: Diagnosis not present

## 2017-06-16 DIAGNOSIS — R002 Palpitations: Secondary | ICD-10-CM

## 2017-06-16 DIAGNOSIS — R109 Unspecified abdominal pain: Secondary | ICD-10-CM | POA: Diagnosis not present

## 2017-06-16 LAB — COMPREHENSIVE METABOLIC PANEL
ALT: 13 U/L — AB (ref 14–54)
AST: 18 U/L (ref 15–41)
Albumin: 3.4 g/dL — ABNORMAL LOW (ref 3.5–5.0)
Alkaline Phosphatase: 102 U/L (ref 38–126)
Anion gap: 13 (ref 5–15)
BUN: 8 mg/dL (ref 6–20)
CHLORIDE: 106 mmol/L (ref 101–111)
CO2: 18 mmol/L — AB (ref 22–32)
CREATININE: 0.61 mg/dL (ref 0.44–1.00)
Calcium: 9.5 mg/dL (ref 8.9–10.3)
GFR calc Af Amer: >60 mL/min (ref >60)
GLUCOSE: 112 mg/dL — AB (ref 65–99)
Potassium: 4.1 mmol/L (ref 3.5–5.1)
Sodium: 137 mmol/L (ref 135–145)
Total Bilirubin: 0.5 mg/dL (ref 0.3–1.2)
Total Protein: 6.8 g/dL (ref 6.5–8.1)

## 2017-06-16 LAB — URINALYSIS, ROUTINE W REFLEX MICROSCOPIC
Bilirubin Urine: NEGATIVE
KETONES UR: 80 mg/dL — AB
Nitrite: NEGATIVE
PH: 6 (ref 5.0–8.0)
PROTEIN: NEGATIVE mg/dL
Specific Gravity, Urine: 1.008 (ref 1.005–1.030)

## 2017-06-16 LAB — CBC
HCT: 34.4 % — ABNORMAL LOW (ref 36.0–46.0)
HEMOGLOBIN: 11.6 g/dL — AB (ref 12.0–15.0)
MCH: 29.7 pg (ref 26.0–34.0)
MCHC: 33.7 g/dL (ref 30.0–36.0)
MCV: 88.2 fL (ref 78.0–100.0)
Platelets: 217 10*3/uL (ref 150–400)
RBC: 3.9 MIL/uL (ref 3.87–5.11)
RDW: 14.7 % (ref 11.5–15.5)
WBC: 14.3 10*3/uL — ABNORMAL HIGH (ref 4.0–10.5)

## 2017-06-16 MED ORDER — NIFEDIPINE 10 MG PO CAPS
20.0000 mg | ORAL_CAPSULE | Freq: Three times a day (TID) | ORAL | Status: DC
  Administered 2017-06-17: 20 mg via ORAL
  Filled 2017-06-16: qty 2

## 2017-06-16 MED ORDER — PROMETHAZINE HCL 25 MG PO TABS
25.0000 mg | ORAL_TABLET | Freq: Every evening | ORAL | Status: DC | PRN
  Administered 2017-06-17: 25 mg via ORAL
  Filled 2017-06-16: qty 1

## 2017-06-16 MED ORDER — NIFEDIPINE 10 MG PO CAPS
20.0000 mg | ORAL_CAPSULE | Freq: Once | ORAL | Status: AC
  Administered 2017-06-16: 20 mg via ORAL
  Filled 2017-06-16: qty 2

## 2017-06-16 MED ORDER — PRENATAL MULTIVITAMIN CH
1.0000 | ORAL_TABLET | Freq: Every day | ORAL | Status: DC
  Filled 2017-06-16: qty 1

## 2017-06-16 MED ORDER — DOCUSATE SODIUM 100 MG PO CAPS
100.0000 mg | ORAL_CAPSULE | Freq: Every day | ORAL | Status: DC
  Filled 2017-06-16 (×2): qty 1

## 2017-06-16 MED ORDER — CALCIUM CARBONATE ANTACID 500 MG PO CHEW: 2.0000 | CHEWABLE_TABLET | ORAL | Status: DC | PRN

## 2017-06-16 MED ORDER — CALCIUM CARBONATE ANTACID 500 MG PO CHEW
1.0000 | CHEWABLE_TABLET | Freq: Once | ORAL | Status: AC
  Administered 2017-06-16: 200 mg via ORAL
  Filled 2017-06-16: qty 1

## 2017-06-16 MED ORDER — ACETAMINOPHEN 325 MG PO TABS: 650.0000 mg | ORAL_TABLET | ORAL | Status: DC | PRN

## 2017-06-16 NOTE — MAU Note (Signed)
Was in the office yesterday, was 1-2 cm.  Given BMZ then and today.  Having lower back pain that is intermittent.  Took tylenol and drank water-not getting any better.  No FFN swab done.  No VB.  Reports good FM.

## 2017-06-16 NOTE — MAU Provider Note (Signed)
Chief Complaint:  Back Pain   First Provider Initiated Contact with Patient 06/16/17 2152     HPI: Amanda Hicks is a 22 y.o. G1P0 at 76w2dwho presents to maternity admissions reporting uterine cramping all day.  Has received Betamethasone in office. . She reports good fetal movement, denies LOF, vaginal bleeding, vaginal itching/burning, urinary symptoms, h/a, dizziness, n/v, diarrhea, constipation or fever/chills.  She denies headache or RUQ abdominal pain.  Did have flashes of light earlier today.    Back Pain  This is a new problem. The current episode started yesterday. The problem occurs intermittently. The problem is unchanged. The pain is present in the lumbar spine. The quality of the pain is described as cramping. The pain does not radiate. The pain is mild. Associated symptoms include abdominal pain and pelvic pain. Pertinent negatives include no dysuria, fever, headaches or paresthesias. She has tried nothing for the symptoms.  Hypertension  This is a new problem. The current episode started today. Associated symptoms include blurred vision (flashes of light). Pertinent negatives include no anxiety or headaches. There are no associated agents to hypertension. There are no known risk factors for coronary artery disease. There are no compliance problems.    RN Note: Was in the office yesterday, was 1-2 cm.  Given BMZ then and today.  Having lower back pain that is intermittent.  Took tylenol and drank water-not getting any better.  No FFN swab done.  No VB.  Reports good FM.      Past Medical History: Past Medical History:  Diagnosis Date  . Dizziness   . GERD (gastroesophageal reflux disease)   . Hyperemesis affecting pregnancy, antepartum   . Low TSH level   . Nasal congestion related to pregnancy   . Obesity in pregnancy   . PCOS (polycystic ovarian syndrome)   . Polycystic ovaries   . Tachycardia   . Tonsillitis 07/2012  . URI, acute   . Vomiting blood     Past  obstetric history: OB History  Gravida Para Term Preterm AB Living  1            SAB TAB Ectopic Multiple Live Births               # Outcome Date GA Lbr Len/2nd Weight Sex Delivery Anes PTL Lv  1 Current             Past Surgical History: Past Surgical History:  Procedure Laterality Date  . OOPHORECTOMY Left 02/2012  . TONSILLECTOMY N/A 08/24/2012   Procedure: TONSILLECTOMY and ADENOIDECTOMY;  Surgeon: Drema Halon, MD;  Location: [facility];  Service: ENT;  Laterality: N/A;    Family History: Family History  Problem Relation Age of Onset  . Hypertension Mother   . Diabetes Mellitus I Father     Social History: Social History   Tobacco Use  . Smoking status: Never Smoker  . Smokeless tobacco: Never Used  Substance Use Topics  . Alcohol use: No  . Drug use: No    Allergies: No Known Allergies  Meds:  Medications Prior to Admission  Medication Sig Dispense Refill Last Dose  . acetaminophen (TYLENOL) 500 MG tablet Take 1,000 mg by mouth every 6 (six) hours as needed for moderate pain.   06/16/2017 at Unknown time  . Prenatal Vit-Fe Fumarate-FA (MULTIVITAMIN-PRENATAL) 27-0.8 MG TABS tablet Take 1 tablet by mouth daily at 12 noon.   06/16/2017 at Unknown time    I have reviewed patient's Past Medical  Hx, Surgical Hx, Family Hx, Social Hx, medications and allergies.   ROS:  Review of Systems  Constitutional: Negative for fever.  Eyes: Positive for blurred vision (flashes of light).  Gastrointestinal: Positive for abdominal pain.  Genitourinary: Positive for pelvic pain. Negative for dysuria.  Musculoskeletal: Positive for back pain.  Neurological: Negative for headaches and paresthesias.   Other systems negative  Physical Exam   Patient Vitals for the past 24 hrs:  BP Temp Temp src Pulse Resp SpO2  06/16/17 2123 139/71 98.8 F (37.1 C) Oral (!) 113 20 98 %   Constitutional: Well-developed, well-nourished female in no acute distress.   Cardiovascular: normal rate and rhythm Respiratory: normal effort, clear to auscultation bilaterally GI: Abd soft, non-tender, gravid appropriate for gestational age.   No rebound or guarding. MS: Extremities nontender, 1-2+ pedal edema, normal ROM Neurologic: Alert and oriented x 4. DTRs 3+ GU: Neg CVAT.  PELVIC EXAM:   Dilation: 1.5 Effacement (%): 80 Station: -1 Presentation: Vertex Exam by:: Wynelle BourgeoisMarie Kalden Wanke, CNM   FHT:  Baseline 130 , moderate variability, accelerations present, no decelerations Contractions: q 1.5 mins    Labs: Results for orders placed or performed during the hospital encounter of 06/16/17 (from the past 24 hour(s))  Urinalysis, Routine w reflex microscopic     Status: Abnormal   Collection Time: 06/16/17  9:03 PM  Result Value Ref Range   Color, Urine YELLOW YELLOW   APPearance HAZY (A) CLEAR   Specific Gravity, Urine 1.008 1.005 - 1.030   pH 6.0 5.0 - 8.0   Glucose, UA >=500 (A) NEGATIVE mg/dL   Hgb urine dipstick SMALL (A) NEGATIVE   Bilirubin Urine NEGATIVE NEGATIVE   Ketones, ur 80 (A) NEGATIVE mg/dL   Protein, ur NEGATIVE NEGATIVE mg/dL   Nitrite NEGATIVE NEGATIVE   Leukocytes, UA TRACE (A) NEGATIVE   RBC / HPF 0-5 0 - 5 RBC/hpf   WBC, UA 0-5 0 - 5 WBC/hpf   Bacteria, UA MANY (A) NONE SEEN   Squamous Epithelial / LPF 0-5 0 - 5   Mucus PRESENT   CBC     Status: Abnormal   Collection Time: 06/16/17 10:47 PM  Result Value Ref Range   WBC 14.3 (H) 4.0 - 10.5 K/uL   RBC 3.90 3.87 - 5.11 MIL/uL   Hemoglobin 11.6 (L) 12.0 - 15.0 g/dL   HCT 16.134.4 (L) 09.636.0 - 04.546.0 %   MCV 88.2 78.0 - 100.0 fL   MCH 29.7 26.0 - 34.0 pg   MCHC 33.7 30.0 - 36.0 g/dL   RDW 40.914.7 81.111.5 - 91.415.5 %   Platelets 217 150 - 400 K/uL  Comprehensive metabolic panel     Status: Abnormal   Collection Time: 06/16/17 10:47 PM  Result Value Ref Range   Sodium 137 135 - 145 mmol/L   Potassium 4.1 3.5 - 5.1 mmol/L   Chloride 106 101 - 111 mmol/L   CO2 18 (L) 22 - 32 mmol/L    Glucose, Bld 112 (H) 65 - 99 mg/dL   BUN 8 6 - 20 mg/dL   Creatinine, Ser 7.820.61 0.44 - 1.00 mg/dL   Calcium 9.5 8.9 - 95.610.3 mg/dL   Total Protein 6.8 6.5 - 8.1 g/dL   Albumin 3.4 (L) 3.5 - 5.0 g/dL   AST 18 15 - 41 U/L   ALT 13 (L) 14 - 54 U/L   Alkaline Phosphatase 102 38 - 126 U/L   Total Bilirubin 0.5 0.3 - 1.2 mg/dL   GFR calc  non Af Amer >60 >60 mL/min   GFR calc Af Amer >60 >60 mL/min   Anion gap 13 5 - 15    Imaging:  No results found.  MAU Course/MDM: I have ordered labs and reviewed results. Pr/Cr ratio pending NST reviewed, reactive Consult Dr Juliene Pina with presentation, exam findings and test results.  Treatments in MAU included EFM, Procardia 20mg  which did not slow contractions. While she was here, noted elevated systolic BPs.  Preeclampsia labs sent.   Updated Dr Ennis Forts on persistence of contractions and new onset hypertension. Will admit for observation.    Assessment: Single IUP at [redacted]w[redacted]d Preterm uterine contractions with slight cervical change New Gestational Hypertension  Plan: Admit to Antenatal Procardia tocolysis EFM MD to follow  Wynelle Bourgeois CNM, MSN Certified Nurse-Midwife 06/16/2017 9:52 PM

## 2017-06-17 DIAGNOSIS — Z3A35 35 weeks gestation of pregnancy: Secondary | ICD-10-CM | POA: Diagnosis not present

## 2017-06-17 LAB — PROTEIN / CREATININE RATIO, URINE
Creatinine, Urine: 63 mg/dL
Protein Creatinine Ratio: 0.17 mg/mg{Cre} — ABNORMAL HIGH (ref 0.00–0.15)
Total Protein, Urine: 11 mg/dL

## 2017-06-17 MED ORDER — NIFEDIPINE 10 MG PO CAPS: 10.0000 mg | ORAL_CAPSULE | Freq: Three times a day (TID) | ORAL | Status: DC

## 2017-06-17 MED ORDER — NALBUPHINE HCL 10 MG/ML IJ SOLN
10.0000 mg | INTRAMUSCULAR | Status: DC | PRN
  Administered 2017-06-17: 10 mg via INTRAVENOUS
  Filled 2017-06-17: qty 1

## 2017-06-17 MED ORDER — CEPHALEXIN 500 MG PO CAPS: 500.0000 mg | ORAL_CAPSULE | Freq: Two times a day (BID) | ORAL | 0 refills | Status: AC

## 2017-06-17 MED ORDER — NIFEDIPINE 10 MG PO CAPS: 10.0000 mg | ORAL_CAPSULE | Freq: Three times a day (TID) | ORAL | 0 refills | Status: DC

## 2017-06-17 MED ORDER — PRENATAL MULTIVITAMIN CH: 1.0000 | ORAL_TABLET | Freq: Every day | ORAL | Status: DC

## 2017-06-17 MED ORDER — CEFAZOLIN SODIUM-DEXTROSE 2-4 GM/100ML-% IV SOLN
2.0000 g | Freq: Three times a day (TID) | INTRAVENOUS | Status: DC
  Administered 2017-06-17 (×2): 2 g via INTRAVENOUS
  Filled 2017-06-17 (×2): qty 100

## 2017-06-17 MED ORDER — PROMETHAZINE HCL 25 MG/ML IJ SOLN: 25.0000 mg | Freq: Four times a day (QID) | INTRAMUSCULAR | Status: DC | PRN

## 2017-06-17 MED ORDER — ACETAMINOPHEN 325 MG PO TABS: 650.0000 mg | ORAL_TABLET | ORAL | Status: DC | PRN

## 2017-06-17 NOTE — Discharge Summary (Signed)
Physician Discharge Summary  Patient ID: Amanda Hicks MRN: [id number] DOB/AGE: [birth year] 21 y.o.  Admit date: 06/16/2017 Discharge date: 06/17/2017  Admission Diagnoses: 35.2 weeks threatened preterm labor, cervical dilatation, painful contractions   Discharge Diagnoses:  Active Problems:   Preterm labor elevated BPs  Discharged Condition: fair  Hospital Course: Patient presented to MAU with painful contractions, she received procardia 20mg  PO with no change in contractions and cervical change noted from 1 cm to 2 cm, uterine irritability with contractions noted but no clinical evidence of abruption, FHT was category I. She was admitted, PIH labs normal, BP only elevated with pain, cervix dilated to 3 cm but further dilatation stopped, repeat exam unchanged after 6 hours. So decision made for discharge.  Urine ?UTI, culture sent, on Ancef, 2 doses, will take Keflex 500mg  bid until we have culture   Discharge Exam: Blood pressure (!) 130/59, pulse 97, temperature 98.9 F (37.2 C), temperature source Oral, resp. rate 16, height 5\' 4"  (1.626 m), weight 274 lb (124.3 kg), SpO2 98 %.  CBC    Component Value Date/Time   WBC 14.3 (H) 06/16/2017 2247   RBC 3.90 06/16/2017 2247   HGB 11.6 (L) 06/16/2017 2247   HCT 34.4 (L) 06/16/2017 2247   PLT 217 06/16/2017 2247   MCV 88.2 06/16/2017 2247   MCH 29.7 06/16/2017 2247   MCHC 33.7 06/16/2017 2247   RDW 14.7 06/16/2017 2247   LYMPHSABS 3.8 02/12/2010 0035   MONOABS 1.1 02/12/2010 0035   EOSABS 0.2 02/12/2010 0035   BASOSABS 0.0 02/12/2010 0035   CMP     Component Value Date/Time   NA 137 06/16/2017 2247   K 4.1 06/16/2017 2247   CL 106 06/16/2017 2247   CO2 18 (L) 06/16/2017 2247   GLUCOSE 112 (H) 06/16/2017 2247   BUN 8 06/16/2017 2247   CREATININE 0.61 06/16/2017 2247   CALCIUM 9.5 06/16/2017 2247   PROT 6.8 06/16/2017 2247   ALBUMIN 3.4 (L) 06/16/2017 2247   AST 18 06/16/2017 2247   ALT 13 (L) 06/16/2017 2247   ALKPHOS 102 06/16/2017 2247   BILITOT 0.5 06/16/2017 2247   GFRNONAA >60 06/16/2017 2247   GFRAA >60 06/16/2017 2247   Urine P/C ratio 0.17 Urine culture pending   Disposition: Home. Needs to stay at home with modified bedrest  Discharge Meds:  .  NIFEdipine (PROCARDIA) 10 MG capsule, Take 1 capsule (10 mg total) by mouth every 8 (eight) hours for 4 days. .  CephALEXin (KEFLEX) 500 MG capsule, Take 1 capsule (500 mg total) by mouth 2 (two) times daily for 5 days, will call you when we have urine culture   .  Prenatal vitamin daily, Acetaminophen (TYLENOL) tablet 650 mg every 8 hrs as needed for pain   Follow-up Information    Shea EvansMody, Zohan Shiflet, MD Follow up.   Specialty:  Obstetrics and Gynecology Why:  office will call you with an appointment  Contact information: [address] [place] [id number] [phone]           Signed: Robley FriesVaishali R Baneza Bartoszek 06/17/2017, 10:50 AM

## 2017-06-17 NOTE — Progress Notes (Signed)
Lavena Bullionllison C Chock is a 22 y.o. G1P0 at 3965w3d admitted for painful contractions and cervical dilatation in MAU.   Subjective: Slept overnight after Nubain. Contractions reported but only rare strong, most are cramps. No vag bleeding/ vag fluid No HA/ vision changes and no RUQ pain.  Urine P/C ratio normal 0.17  Objective: BP (!) 130/59   Pulse 97   Temp 98.9 F (37.2 C) (Oral)   Resp 16   Ht 5\' 4"  (1.626 m)   Wt 274 lb (124.3 kg)   SpO2 98%   BMI 47.03 kg/m   BP: (119-156)/(55-84) 130/59 (04/24 1000)  FHT:  FHR: 130s bpm, variability: moderate,  accelerations:  Present,  decelerations:  Absent UC:   Irritability- UCs resolved  SVE:   Dilation: 3 Effacement (%): 70 Station: -2 Exam by:: dr Robertlee Rogacki Unchanged overnight   Labs: Lab Results  Component Value Date   WBC 14.3 (H) 06/16/2017   HGB 11.6 (L) 06/16/2017   HCT 34.4 (L) 06/16/2017   MCV 88.2 06/16/2017   PLT 217 06/16/2017   CMP     Component Value Date/Time   NA 137 06/16/2017 2247   K 4.1 06/16/2017 2247   CL 106 06/16/2017 2247   CO2 18 (L) 06/16/2017 2247   GLUCOSE 112 (H) 06/16/2017 2247   BUN 8 06/16/2017 2247   CREATININE 0.61 06/16/2017 2247   CALCIUM 9.5 06/16/2017 2247   PROT 6.8 06/16/2017 2247   ALBUMIN 3.4 (L) 06/16/2017 2247   AST 18 06/16/2017 2247   ALT 13 (L) 06/16/2017 2247   ALKPHOS 102 06/16/2017 2247   BILITOT 0.5 06/16/2017 2247   GFRNONAA >60 06/16/2017 2247   GFRAA >60 06/16/2017 2247   Urine P/C 0.17  Urine culture sent  GBS -was done no report noted   Assessment / Plan: 1) 35.3 wks, threatened preterm labor, now improved with no cervical change. Procardia 20mg  q 6 hrs since admission and IV hydration, s/p Nubain for "labor pain". Considering no cervical change, plan to discharge home.  Continue Procardia 10mg  q 8 hrs till 36 wks as long as tolerated well.  OOW and hydrate and modified rest   2) Elevated BPs- some higher one with pain from contractions, overall none in severe  range. Gestational HTN vs transient from pain PIH labs and p/c ratio normal. No antiHTN (though she is on procardia for UCs)  3) suspect UTI vs contamination. S/p Ancef 2 gm q 8 hrs (2nd dose now), keflex 500mg  bid until ucx back.   F/up office visit for exam and BP check 4/26   Robley FriesVaishali R Carollynn Pennywell 06/17/2017, 10:26 AM

## 2017-06-17 NOTE — Progress Notes (Signed)
Patient is contracting every 1-3 minutes. Will do a vaginal exam and get back to Dr. Juliene PinaMody if there is cervical change

## 2017-06-17 NOTE — MAU Provider Note (Signed)
Amanda Hicks is a 22 y.o. G1P0 at 7535w2dwho presents to maternity admissions reporting uterine cramping all day.  Has received Betamethasone in office 4/22, 4/23.  She reports good fetal movement, denies LOF, vaginal bleeding, urinary symptoms, h/a, dizziness, n/v, diarrhea, constipation or fever/chills.  She denies headache or RUQ abdominal pain.  Did have flashes of light earlier today.   In MAU her contractions were regular every 2 min, pain got worse and she didn't respond to Procardia 20mg  single dose to stop contractions, so she was admitted for observation and possible threatened preterm labor.  BP - systolic elevation, PIH labs normal, urine prot/creat ratio pending  Pregnancy was spontaneous , took Metformin pre-preg and stopped at 10 weeks. Passed Glucola. No prior elevated BPs Low TSH but normal free T3 and T4 levels.   Past Medical History: Past Medical History:  Diagnosis Date  . Dizziness   . GERD (gastroesophageal reflux disease)   . Hyperemesis affecting pregnancy, antepartum   . Low TSH level   . Nasal congestion related to pregnancy   . Obesity in pregnancy   . PCOS (polycystic ovarian syndrome)   . Polycystic ovaries   . Tachycardia   . Tonsillitis 07/2012  . URI, acute   . Vomiting blood     Past obstetric history: OB History  Gravida Para Term Preterm AB Living  1            SAB TAB Ectopic Multiple Live Births               # Outcome Date GA Lbr Len/2nd Weight Sex Delivery Anes PTL Lv  1 Current             Past Surgical History: Past Surgical History:  Procedure Laterality Date  . OOPHORECTOMY Left 02/2012  . TONSILLECTOMY N/A 08/24/2012   Procedure: TONSILLECTOMY and ADENOIDECTOMY;  Surgeon: Drema Halonhristopher E Newman, MD;  Location: [facility];  Service: ENT;  Laterality: N/A;    Family History: Family History  Problem Relation Age of Onset  . Hypertension Mother   . Diabetes Mellitus I Father     Social History: Social History    Tobacco Use  . Smoking status: Never Smoker  . Smokeless tobacco: Never Used  Substance Use Topics  . Alcohol use: No  . Drug use: No    Allergies: No Known Allergies  Meds:  Medications Prior to Admission  Medication Sig Dispense Refill Last Dose  . acetaminophen (TYLENOL) 500 MG tablet Take 1,000 mg by mouth every 6 (six) hours as needed for moderate pain.   06/16/2017 at Unknown time  . Prenatal Vit-Fe Fumarate-FA (MULTIVITAMIN-PRENATAL) 27-0.8 MG TABS tablet Take 1 tablet by mouth daily at 12 noon.   06/16/2017 at Unknown time    I have reviewed patient's Past Medical Hx, Surgical Hx, Family Hx, Social Hx, medications and allergies.   ROS:  Review of Systems  Constitutional: Negative for fever.  Eyes: Positive for blurred vision (flashes of light).  Gastrointestinal: Positive for abdominal pain.  Genitourinary: Positive for pelvic pain. Negative for dysuria.  Musculoskeletal: Positive for back pain.  Neurological: Negative for headaches and paresthesias.   Other systems negative  Physical Exam   Patient Vitals for the past 24 hrs:  BP Temp Temp src Pulse Resp SpO2  06/17/17 0101 (!) 144/84 - - 98 - 99 %  06/16/17 2343 (!) 156/71 99.3 F (37.4 C) Oral (!) 110 - 99 %  06/16/17 2301 (!) 144/74 - - Marland Kitchen(!)  104 - -  06/16/17 2246 (!) 147/78 - - (!) 103 - -  06/16/17 2236 (!) 151/84 - - (!) 105 - -  06/16/17 2221 (!) 148/74 - - (!) 113 - -  06/16/17 2123 139/71 98.8 F (37.1 C) Oral (!) 113 20 98 %   Constitutional: Well-developed, well-nourished female in no acute distress.  Cardiovascular: normal rate and rhythm Respiratory: normal effort, clear to auscultation bilaterally GI: Abd soft, non-tender, gravid appropriate for gestational age.   No rebound or guarding. Uterus tone soft in b/w contractions. MS: Extremities nontender, 2+ LE edema, normal ROM Neurologic: Alert and oriented x 4. DTRs 3+ GU: Neg CVAT.  PELVIC EXAM: Per RN - 3/70%/-2/ Intact vertex-- 1.30 am.  This is a change from MAU exam  FHT:  Baseline 130 , moderate variability, accelerations present, no decelerations Contractions: q 1.5 mins    Labs: Results for orders placed or performed during the hospital encounter of 06/16/17 (from the past 24 hour(s))  Urinalysis, Routine w reflex microscopic     Status: Abnormal   Collection Time: 06/16/17  9:03 PM  Result Value Ref Range   Color, Urine YELLOW YELLOW   APPearance HAZY (A) CLEAR   Specific Gravity, Urine 1.008 1.005 - 1.030   pH 6.0 5.0 - 8.0   Glucose, UA >=500 (A) NEGATIVE mg/dL   Hgb urine dipstick SMALL (A) NEGATIVE   Bilirubin Urine NEGATIVE NEGATIVE   Ketones, ur 80 (A) NEGATIVE mg/dL   Protein, ur NEGATIVE NEGATIVE mg/dL   Nitrite NEGATIVE NEGATIVE   Leukocytes, UA TRACE (A) NEGATIVE   RBC / HPF 0-5 0 - 5 RBC/hpf   WBC, UA 0-5 0 - 5 WBC/hpf   Bacteria, UA MANY (A) NONE SEEN   Squamous Epithelial / LPF 0-5 0 - 5   Mucus PRESENT   CBC     Status: Abnormal   Collection Time: 06/16/17 10:47 PM  Result Value Ref Range   WBC 14.3 (H) 4.0 - 10.5 K/uL   RBC 3.90 3.87 - 5.11 MIL/uL   Hemoglobin 11.6 (L) 12.0 - 15.0 g/dL   HCT 09.8 (L) 11.9 - 14.7 %   MCV 88.2 78.0 - 100.0 fL   MCH 29.7 26.0 - 34.0 pg   MCHC 33.7 30.0 - 36.0 g/dL   RDW 82.9 56.2 - 13.0 %   Platelets 217 150 - 400 K/uL  Comprehensive metabolic panel     Status: Abnormal   Collection Time: 06/16/17 10:47 PM  Result Value Ref Range   Sodium 137 135 - 145 mmol/L   Potassium 4.1 3.5 - 5.1 mmol/L   Chloride 106 101 - 111 mmol/L   CO2 18 (L) 22 - 32 mmol/L   Glucose, Bld 112 (H) 65 - 99 mg/dL   BUN 8 6 - 20 mg/dL   Creatinine, Ser 8.65 0.44 - 1.00 mg/dL   Calcium 9.5 8.9 - 78.4 mg/dL   Total Protein 6.8 6.5 - 8.1 g/dL   Albumin 3.4 (L) 3.5 - 5.0 g/dL   AST 18 15 - 41 U/L   ALT 13 (L) 14 - 54 U/L   Alkaline Phosphatase 102 38 - 126 U/L   Total Bilirubin 0.5 0.3 - 1.2 mg/dL   GFR calc non Af Amer >60 >60 mL/min   GFR calc Af Amer >60 >60 mL/min    Anion gap 13 5 - 15   Urine P/C ratio pending  Urine culture will be sent   Assessment: 22 yo G1, [redacted]w[redacted]d. Threatened preterm  labor, uterine contractions, pain, cervical change. FHT category I New diagnosis of gestational hypertension v/s elevated BPs due to pain. Urine p/c pending No clinical evidence of abruption with soft relaxed uterus and no pain in b/w contractions no bleeding, FHT - cat I Plan: Admit to Antenatal. Continue Procardia tocolysis upto 24 hrs past 2nd steroid dose  Nubain 10 mg q 3 hrs, 2 doses, continue to watch progress, repeat exam q 2 hrs or sooner if UCs worse/ membranes rupture/ note bleeding. Transfer to L&D if further cervical dilation/ labor/ PROM IV Bolus LR 500 cc  Rapid GBS UCx ordered, start IV Ancef for bacteria(+) but nitrites neg.   I assessed patient and reviewed A/P with her and RN

## 2017-06-17 NOTE — Progress Notes (Signed)
Discharge teaching complete with pt. Instructions understood all information and did not have any questions. Pt pushed via wheelchair and discharged home to family.

## 2017-06-17 NOTE — Discharge Instructions (Signed)
Pelvic rest, monitor fetal movements, monitor contractions/ vaginal bleeding/ large gush of fluid Watch for severe headache and vision floaters with headache, epigastric pain, worsening swelling  Urine culture is pending, so take antibiotic until we have the result back and will contact you as needed

## 2017-06-17 NOTE — H&P (Signed)
Amanda Hicks is a 22 y.o. G1P0 at 7535w2dwho presents to maternity admissions reporting uterine cramping all day.  Has received Betamethasone in office 4/22, 4/23.  She reports good fetal movement, denies LOF, vaginal bleeding, urinary symptoms, h/a, dizziness, n/v, diarrhea, constipation or fever/chills.  She denies headache or RUQ abdominal pain.  Did have flashes of light earlier today.   In MAU her contractions were regular every 2 min, pain got worse and she didn't respond to Procardia 20mg  single dose to stop contractions, so she was admitted for observation and possible threatened preterm labor.  BP - systolic elevation, PIH labs normal, urine prot/creat ratio pending  Pregnancy was spontaneous , took Metformin pre-preg and stopped at 10 weeks. Passed Glucola. No prior elevated BPs Low TSH but normal free T3 and T4 levels.   Past Medical History: Past Medical History:  Diagnosis Date  . Dizziness   . GERD (gastroesophageal reflux disease)   . Hyperemesis affecting pregnancy, antepartum   . Low TSH level   . Nasal congestion related to pregnancy   . Obesity in pregnancy   . PCOS (polycystic ovarian syndrome)   . Polycystic ovaries   . Tachycardia   . Tonsillitis 07/2012  . URI, acute   . Vomiting blood     Past obstetric history: OB History  Gravida Para Term Preterm AB Living  1            SAB TAB Ectopic Multiple Live Births               # Outcome Date GA Lbr Len/2nd Weight Sex Delivery Anes PTL Lv  1 Current             Past Surgical History: Past Surgical History:  Procedure Laterality Date  . OOPHORECTOMY Left 02/2012  . TONSILLECTOMY N/A 08/24/2012   Procedure: TONSILLECTOMY and ADENOIDECTOMY;  Surgeon: Drema Halonhristopher E Newman, MD;  Location: [facility];  Service: ENT;  Laterality: N/A;    Family History: Family History  Problem Relation Age of Onset  . Hypertension Mother   . Diabetes Mellitus I Father     Social History: Social History    Tobacco Use  . Smoking status: Never Smoker  . Smokeless tobacco: Never Used  Substance Use Topics  . Alcohol use: No  . Drug use: No    Allergies: No Known Allergies  Meds:  Medications Prior to Admission  Medication Sig Dispense Refill Last Dose  . acetaminophen (TYLENOL) 500 MG tablet Take 1,000 mg by mouth every 6 (six) hours as needed for moderate pain.   06/16/2017 at Unknown time  . Prenatal Vit-Fe Fumarate-FA (MULTIVITAMIN-PRENATAL) 27-0.8 MG TABS tablet Take 1 tablet by mouth daily at 12 noon.   06/16/2017 at Unknown time    I have reviewed patient's Past Medical Hx, Surgical Hx, Family Hx, Social Hx, medications and allergies.   ROS:  Review of Systems  Constitutional: Negative for fever.  Eyes: Positive for blurred vision (flashes of light).  Gastrointestinal: Positive for abdominal pain.  Genitourinary: Positive for pelvic pain. Negative for dysuria.  Musculoskeletal: Positive for back pain.  Neurological: Negative for headaches and paresthesias.   Other systems negative  Physical Exam   Patient Vitals for the past 24 hrs:  BP Temp Temp src Pulse Resp SpO2  06/17/17 0101 (!) 144/84 - - 98 - 99 %  06/16/17 2343 (!) 156/71 99.3 F (37.4 C) Oral (!) 110 - 99 %  06/16/17 2301 (!) 144/74 - - Amanda Hicks Kitchen(!)  104 - -  06/16/17 2246 (!) 147/78 - - (!) 103 - -  06/16/17 2236 (!) 151/84 - - (!) 105 - -  06/16/17 2221 (!) 148/74 - - (!) 113 - -  06/16/17 2123 139/71 98.8 F (37.1 C) Oral (!) 113 20 98 %   Constitutional: Well-developed, well-nourished female in no acute distress.  Cardiovascular: normal rate and rhythm Respiratory: normal effort, clear to auscultation bilaterally GI: Abd soft, non-tender, gravid appropriate for gestational age.   No rebound or guarding. Uterus tone soft in b/w contractions. MS: Extremities nontender, 2+ LE edema, normal ROM Neurologic: Alert and oriented x 4. DTRs 3+ GU: Neg CVAT.  PELVIC EXAM: Per RN - 3/70%/-2/ Intact vertex-- 1.30 am.  This is a change from MAU exam  FHT:  Baseline 130 , moderate variability, accelerations present, no decelerations Contractions: q 1.5 mins    Labs: Results for orders placed or performed during the hospital encounter of 06/16/17 (from the past 24 hour(s))  Urinalysis, Routine w reflex microscopic     Status: Abnormal   Collection Time: 06/16/17  9:03 PM  Result Value Ref Range   Color, Urine YELLOW YELLOW   APPearance HAZY (A) CLEAR   Specific Gravity, Urine 1.008 1.005 - 1.030   pH 6.0 5.0 - 8.0   Glucose, UA >=500 (A) NEGATIVE mg/dL   Hgb urine dipstick SMALL (A) NEGATIVE   Bilirubin Urine NEGATIVE NEGATIVE   Ketones, ur 80 (A) NEGATIVE mg/dL   Protein, ur NEGATIVE NEGATIVE mg/dL   Nitrite NEGATIVE NEGATIVE   Leukocytes, UA TRACE (A) NEGATIVE   RBC / HPF 0-5 0 - 5 RBC/hpf   WBC, UA 0-5 0 - 5 WBC/hpf   Bacteria, UA MANY (A) NONE SEEN   Squamous Epithelial / LPF 0-5 0 - 5   Mucus PRESENT   CBC     Status: Abnormal   Collection Time: 06/16/17 10:47 PM  Result Value Ref Range   WBC 14.3 (H) 4.0 - 10.5 K/uL   RBC 3.90 3.87 - 5.11 MIL/uL   Hemoglobin 11.6 (L) 12.0 - 15.0 g/dL   HCT 16.1 (L) 09.6 - 04.5 %   MCV 88.2 78.0 - 100.0 fL   MCH 29.7 26.0 - 34.0 pg   MCHC 33.7 30.0 - 36.0 g/dL   RDW 40.9 81.1 - 91.4 %   Platelets 217 150 - 400 K/uL  Comprehensive metabolic panel     Status: Abnormal   Collection Time: 06/16/17 10:47 PM  Result Value Ref Range   Sodium 137 135 - 145 mmol/L   Potassium 4.1 3.5 - 5.1 mmol/L   Chloride 106 101 - 111 mmol/L   CO2 18 (L) 22 - 32 mmol/L   Glucose, Bld 112 (H) 65 - 99 mg/dL   BUN 8 6 - 20 mg/dL   Creatinine, Ser 7.82 0.44 - 1.00 mg/dL   Calcium 9.5 8.9 - 95.6 mg/dL   Total Protein 6.8 6.5 - 8.1 g/dL   Albumin 3.4 (L) 3.5 - 5.0 g/dL   AST 18 15 - 41 U/L   ALT 13 (L) 14 - 54 U/L   Alkaline Phosphatase 102 38 - 126 U/L   Total Bilirubin 0.5 0.3 - 1.2 mg/dL   GFR calc non Af Amer >60 >60 mL/min   GFR calc Af Amer >60 >60 mL/min    Anion gap 13 5 - 15   Urine P/C ratio pending  Urine culture will be sent   Prenatal Transfer Tool  Maternal Diabetes: No  Genetic Screening: Normal QUAD Maternal Ultrasounds/Referrals: Normal Fetal Ultrasounds or other Referrals:  None Maternal Substance Abuse:  No Significant Maternal Medications:  Meds include: Other:  Protonix, Zantac prn Significant Maternal Lab Results: None  GBS sent at admission  Prenatal labs:  O(+) Ab screen neg HIV (-) Hepatitis B (-) RPR (-) x 2 Rubella Immune GBS rapid PCR sent (office did on 4/22 is pending)   Assessment: 22 yo G1, 180w2d. Threatened preterm labor, uterine contractions, pain, cervical change. FHT category I New diagnosis of gestational hypertension v/s elevated BPs due to pain. Urine p/c pending No clinical evidence of abruption with soft relaxed uterus and no pain in b/w contractions no bleeding, FHT - cat I Plan: Admit to Antenatal. Continue Procardia tocolysis upto 24 hrs past 2nd steroid dose  Nubain 10 mg q 3 hrs, 2 doses, continue to watch progress, repeat exam q 2 hrs or sooner if UCs worse/ membranes rupture/ note bleeding. Transfer to L&D if further cervical dilation/ labor/ PROM IV Bolus LR 500 cc  Rapid GBS UCx ordered, start IV Ancef for bacteria(+) but nitrites neg.   I assessed patient and reviewed A/P with her and RN

## 2017-06-18 ENCOUNTER — Inpatient Hospital Stay (HOSPITAL_COMMUNITY)
Admission: AD | Admit: 2017-06-18 | Discharge: 2017-06-19 | Disposition: A | Discharge status: Home / Self Care | Payer: BLUE CROSS/BLUE SHIELD | Source: Ambulatory Visit | Attending: Obstetrics and Gynecology | Admitting: Obstetrics and Gynecology

## 2017-06-18 ENCOUNTER — Encounter (HOSPITAL_COMMUNITY): Payer: Self-pay | Admitting: *Deleted

## 2017-06-18 DIAGNOSIS — Z3A35 35 weeks gestation of pregnancy: Secondary | ICD-10-CM | POA: Diagnosis not present

## 2017-06-18 DIAGNOSIS — R109 Unspecified abdominal pain: Secondary | ICD-10-CM | POA: Insufficient documentation

## 2017-06-18 LAB — URINALYSIS, ROUTINE W REFLEX MICROSCOPIC
Bilirubin Urine: NEGATIVE
GLUCOSE, UA: 50 mg/dL — AB
Hgb urine dipstick: NEGATIVE
Ketones, ur: NEGATIVE mg/dL
NITRITE: NEGATIVE
Protein, ur: NEGATIVE mg/dL
SPECIFIC GRAVITY, URINE: 1.016 (ref 1.005–1.030)
WBC, UA: >50 WBC/hpf — ABNORMAL HIGH (ref 0–5)
pH: 6 (ref 5.0–8.0)

## 2017-06-18 MED ORDER — HYDROXYZINE HCL 25 MG PO TABS
25.0000 mg | ORAL_TABLET | Freq: Three times a day (TID) | ORAL | Status: DC | PRN
  Administered 2017-06-19: 25 mg via ORAL
  Filled 2017-06-18 (×2): qty 1

## 2017-06-18 MED ORDER — NIFEDIPINE 10 MG PO CAPS
20.0000 mg | ORAL_CAPSULE | Freq: Once | ORAL | Status: AC
  Administered 2017-06-18: 20 mg via ORAL
  Filled 2017-06-18: qty 2

## 2017-06-18 NOTE — MAU Provider Note (Signed)
Chief Complaint:  Contractions   First Provider Initiated Contact with Patient 06/18/17 2233     HPI: Amanda Hicks is a 22 y.o. G1P0 at 13w5dwho presents to maternity admissions reporting recurrence of contractions.  Was seen and admitted for same on 4/23 and changed her cervix from 1.5 to 3cm.  Was prescribed Procardia for UCs, last took it at 2pm.  .states pain is severe but does not appear uncomfortable She reports good fetal movement, denies LOF, vaginal bleeding, vaginal itching/burning, urinary symptoms, h/a, dizziness, n/v, diarrhea, constipation or fever/chills.  She denies headache, visual changes or RUQ abdominal pain.  Also had some intermittent hypertension but MD feels it was due to pain.   Preeclampsia eval was negative  Abdominal Pain  This is a recurrent problem. The current episode started today. The onset quality is gradual. The problem occurs intermittently. The problem has been unchanged. The pain is located in the suprapubic region, LLQ and RLQ. The quality of the pain is cramping. The abdominal pain does not radiate. Pertinent negatives include no constipation, diarrhea, fever, myalgias, nausea or vomiting. Nothing aggravates the pain. The pain is relieved by nothing. Treatments tried: Procardia. The treatment provided mild relief.   RN Note: PT SAYS UC STARTED  ON Tuesday- WAS HERE - GAVE MEDS TO STOP-  .  SINCE THEN MILD UC-   HAS BEEN  TAKING  PROCARDIA- TOOK LAST AT 2 PM .     LAST 2 HRS- MORE OFTEN.  FEELS PELVIC PRESSURE.     CALLED  DR - TOLD TO COME IN.    Past Medical History: Past Medical History:  Diagnosis Date  . Dizziness   . GERD (gastroesophageal reflux disease)   . Hyperemesis affecting pregnancy, antepartum   . Low TSH level   . Nasal congestion related to pregnancy   . Obesity in pregnancy   . PCOS (polycystic ovarian syndrome)   . Polycystic ovaries   . Tachycardia   . Tonsillitis 07/2012  . URI, acute   . Vomiting blood     Past obstetric  history: OB History  Gravida Para Term Preterm AB Living  1            SAB TAB Ectopic Multiple Live Births               # Outcome Date GA Lbr Len/2nd Weight Sex Delivery Anes PTL Lv  1 Current             Past Surgical History: Past Surgical History:  Procedure Laterality Date  . OOPHORECTOMY Left 02/2012  . TONSILLECTOMY N/A 08/24/2012   Procedure: TONSILLECTOMY and ADENOIDECTOMY;  Surgeon: Drema Halon, MD;  Location: [facility];  Service: ENT;  Laterality: N/A;    Family History: Family History  Problem Relation Age of Onset  . Hypertension Mother   . Diabetes Mellitus I Father     Social History: Social History   Tobacco Use  . Smoking status: Never Smoker  . Smokeless tobacco: Never Used  Substance Use Topics  . Alcohol use: No  . Drug use: No    Allergies: No Known Allergies  Meds:  Medications Prior to Admission  Medication Sig Dispense Refill Last Dose  . acetaminophen (TYLENOL) 325 MG tablet Take 2 tablets (650 mg total) by mouth every 4 (four) hours as needed (for pain scale < 4  OR  temperature  >/=  100.5 F).     Marland Kitchen acetaminophen (TYLENOL) 500 MG tablet Take 1,000  mg by mouth every 6 (six) hours as needed for moderate pain.   06/16/2017 at Unknown time  . cephALEXin (KEFLEX) 500 MG capsule Take 1 capsule (500 mg total) by mouth 2 (two) times daily for 5 days. 10 capsule 0   . NIFEdipine (PROCARDIA) 10 MG capsule Take 1 capsule (10 mg total) by mouth every 8 (eight) hours for 4 days. 12 capsule 0   . Prenatal Vit-Fe Fumarate-FA (MULTIVITAMIN-PRENATAL) 27-0.8 MG TABS tablet Take 1 tablet by mouth daily at 12 noon.   06/16/2017 at Unknown time  . Prenatal Vit-Fe Fumarate-FA (PRENATAL MULTIVITAMIN) TABS tablet Take 1 tablet by mouth daily at 12 noon.       I have reviewed patient's Past Medical Hx, Surgical Hx, Family Hx, Social Hx, medications and allergies.   ROS:  Review of Systems  Constitutional: Negative for fever.   Gastrointestinal: Positive for abdominal pain. Negative for constipation, diarrhea, nausea and vomiting.  Musculoskeletal: Negative for myalgias.   Other systems negative  Physical Exam   Patient Vitals for the past 24 hrs:  BP Temp Temp src Pulse Resp Height Weight  06/18/17 2323 129/79 - - 97 - - -  06/18/17 2300 (!) 142/89 - - 92 - - -  06/18/17 2209 131/82 98.4 F (36.9 C) Oral 96 20 5\' 4"  (1.626 m) 272 lb 12 oz (123.7 kg)   Constitutional: Well-developed, well-nourished female in no acute distress.  Cardiovascular: normal rate and rhythm Respiratory: normal effort, clear to auscultation bilaterally GI: Abd soft, non-tender, gravid appropriate for gestational age.   No rebound or guarding. MS: Extremities nontender, no edema, normal ROM Neurologic: Alert and oriented x 4.  GU: Neg CVAT.  PELVIC EXAM:  Dilation: 3 Effacement (%): 80 Cervical Position: Middle Station: -2 Presentation: Vertex Exam by:: Artelia Laroche, CNM  Recheck an hour later was unchanged.   FHT:  Baseline 135 , moderate variability, accelerations present, no decelerations Contractions: q 1.5 mins Irregular     Labs: Results for orders placed or performed during the hospital encounter of 06/18/17 (from the past 24 hour(s))  Urinalysis, Routine w reflex microscopic     Status: Abnormal   Collection Time: 06/18/17 10:12 PM  Result Value Ref Range   Color, Urine YELLOW YELLOW   APPearance HAZY (A) CLEAR   Specific Gravity, Urine 1.016 1.005 - 1.030   pH 6.0 5.0 - 8.0   Glucose, UA 50 (A) NEGATIVE mg/dL   Hgb urine dipstick NEGATIVE NEGATIVE   Bilirubin Urine NEGATIVE NEGATIVE   Ketones, ur NEGATIVE NEGATIVE mg/dL   Protein, ur NEGATIVE NEGATIVE mg/dL   Nitrite NEGATIVE NEGATIVE   Leukocytes, UA LARGE (A) NEGATIVE   RBC / HPF 6-10 0 - 5 RBC/hpf   WBC, UA >50 (H) 0 - 5 WBC/hpf   Bacteria, UA RARE (A) NONE SEEN   Squamous Epithelial / LPF 0-5 0 - 5   Mucus PRESENT       Imaging:  No results  found.  MAU Course/MDM: I have ordered labs and reviewed results.  NST reviewed, reactive.   UCs persist but did change from 30sec to 15-20 seconds.  Pt reports they have gotten stronger.   Consult Dr Amado Nash with presentation, exam findings and test results.  Treatments in MAU included Procardia 20mg .  No change in contraction frequency but they did shorten.  Per Dr Amado Nash, Vistaril given with an additional observation period  No change in cervix from the initial to second hour. .    Dr Amado Nash  requested 2 more hours of observation Pt states UC have spaced out Cervix unchanged over the past 4.5 hours  Assessment: Single IUP at 3988w5d' Preterm uterine irritability without cervical change  Plan: Discharge home Preterm Labor precautions and fetal kick counts Follow up in Office for prenatal visits and recheck  Encouraged to return here or to other Urgent Care/ED if she develops worsening of symptoms, increase in pain, fever, or other concerning symptoms.   Pt stable at time of discharge.  Wynelle BourgeoisMarie Raima Geathers CNM, MSN Certified Nurse-Midwife 06/19/2017 12:44 AM

## 2017-06-18 NOTE — MAU Note (Signed)
PT SAYS UC STARTED  ON Tuesday- WAS HERE - GAVE MEDS TO STOP-  .  SINCE THEN MILD UC-   HAS BEEN  TAKING  PROCARDIA- TOOK LAST AT 2 PM .     LAST 2 HRS- MORE OFTEN.  FEELS PELVIC PRESSURE.     CALLED  DR - TOLD TO COME IN.

## 2017-06-19 DIAGNOSIS — Z3A35 35 weeks gestation of pregnancy: Secondary | ICD-10-CM | POA: Diagnosis not present

## 2017-06-19 DIAGNOSIS — R109 Unspecified abdominal pain: Secondary | ICD-10-CM | POA: Diagnosis not present

## 2017-06-19 NOTE — Discharge Instructions (Signed)

## 2017-06-20 LAB — CULTURE, OB URINE: Culture: 10000 — AB

## 2017-06-24 ENCOUNTER — Inpatient Hospital Stay (HOSPITAL_COMMUNITY)
Admission: AD | Admit: 2017-06-24 | Discharge: 2017-06-24 | Disposition: A | Discharge status: Home / Self Care | Payer: BLUE CROSS/BLUE SHIELD | Source: Ambulatory Visit | Attending: Obstetrics & Gynecology | Admitting: Obstetrics & Gynecology

## 2017-06-24 DIAGNOSIS — O3663X0 Maternal care for excessive fetal growth, third trimester, not applicable or unspecified: Secondary | ICD-10-CM | POA: Diagnosis not present

## 2017-06-24 DIAGNOSIS — Z3A36 36 weeks gestation of pregnancy: Secondary | ICD-10-CM | POA: Diagnosis not present

## 2017-06-24 DIAGNOSIS — O210 Mild hyperemesis gravidarum: Secondary | ICD-10-CM | POA: Diagnosis not present

## 2017-06-24 DIAGNOSIS — Z3403 Encounter for supervision of normal first pregnancy, third trimester: Secondary | ICD-10-CM | POA: Diagnosis not present

## 2017-06-24 DIAGNOSIS — Z3A37 37 weeks gestation of pregnancy: Secondary | ICD-10-CM | POA: Diagnosis not present

## 2017-06-24 DIAGNOSIS — O99210 Obesity complicating pregnancy, unspecified trimester: Secondary | ICD-10-CM | POA: Diagnosis not present

## 2017-06-24 NOTE — MAU Note (Signed)
Pt reports contractions every for the past few hours. Pt denies LOF or vaginal bleeding. Reports good fetal movement. States she was 4cm today in the office.

## 2017-06-24 NOTE — Discharge Instructions (Signed)
Braxton Hicks Contractions °Contractions of the uterus can occur throughout pregnancy, but they are not always a sign that you are in labor. You may have practice contractions called Braxton Hicks contractions. These false labor contractions are sometimes confused with true labor. °What are Braxton Hicks contractions? °Braxton Hicks contractions are tightening movements that occur in the muscles of the uterus before labor. Unlike true labor contractions, these contractions do not result in opening (dilation) and thinning of the cervix. Toward the end of pregnancy (32-34 weeks), Braxton Hicks contractions can happen more often and may become stronger. These contractions are sometimes difficult to tell apart from true labor because they can be very uncomfortable. You should not feel embarrassed if you go to the hospital with false labor. °Sometimes, the only way to tell if you are in true labor is for your health care provider to look for changes in the cervix. The health care provider will do a physical exam and may monitor your contractions. If you are not in true labor, the exam should show that your cervix is not dilating and your water has not broken. °If there are other health problems associated with your pregnancy, it is completely safe for you to be sent home with false labor. You may continue to have Braxton Hicks contractions until you go into true labor. °How to tell the difference between true labor and false labor °True labor °· Contractions last 30-70 seconds. °· Contractions become very regular. °· Discomfort is usually felt in the top of the uterus, and it spreads to the lower abdomen and low back. °· Contractions do not go away with walking. °· Contractions usually become more intense and increase in frequency. °· The cervix dilates and gets thinner. °False labor °· Contractions are usually shorter and not as strong as true labor contractions. °· Contractions are usually irregular. °· Contractions  are often felt in the front of the lower abdomen and in the groin. °· Contractions may go away when you walk around or change positions while lying down. °· Contractions get weaker and are shorter-lasting as time goes on. °· The cervix usually does not dilate or become thin. °Follow these instructions at home: °· Take over-the-counter and prescription medicines only as told by your health care provider. °· Keep up with your usual exercises and follow other instructions from your health care provider. °· Eat and drink lightly if you think you are going into labor. °· If Braxton Hicks contractions are making you uncomfortable: °? Change your position from lying down or resting to walking, or change from walking to resting. °? Sit and rest in a tub of warm water. °? Drink enough fluid to keep your urine pale yellow. Dehydration may cause these contractions. °? Do slow and deep breathing several times an hour. °· Keep all follow-up prenatal visits as told by your health care provider. This is important. °Contact a health care provider if: °· You have a fever. °· You have continuous pain in your abdomen. °Get help right away if: °· Your contractions become stronger, more regular, and closer together. °· You have fluid leaking or gushing from your vagina. °· You pass blood-tinged mucus (bloody show). °· You have bleeding from your vagina. °· You have low back pain that you never had before. °· You feel your baby’s head pushing down and causing pelvic pressure. °· Your baby is not moving inside you as much as it used to. °Summary °· Contractions that occur before labor are called Braxton   Hicks contractions, false labor, or practice contractions. °· Braxton Hicks contractions are usually shorter, weaker, farther apart, and less regular than true labor contractions. True labor contractions usually become progressively stronger and regular and they become more frequent. °· Manage discomfort from Braxton Hicks contractions by  changing position, resting in a warm bath, drinking plenty of water, or practicing deep breathing. °This information is not intended to replace advice given to you by your health care provider. Make sure you discuss any questions you have with your health care provider. °Document Released: 06/26/2016 Document Revised: 06/26/2016 Document Reviewed: 06/26/2016 °Elsevier Interactive Patient Education © 2018 [employer] ° °

## 2017-06-24 NOTE — MAU Note (Signed)
I have communicated with Dr Juliene Pina and reviewed vital signs:      Vitals:   06/24/17 2210 06/24/17 2313  BP: 135/71 127/77  Pulse: (!) 116 99  Resp: 18   Temp: 98.8 F (37.1 C)   SpO2: 99% 98%    Vaginal exam:  Dilation: 3.5 Effacement (%): 50 Cervical Position: Middle Station: -2 Exam by:: Kerby Moors, RN,   Also reviewed contraction pattern and that non-stress test is reactive.  It has been documented that patient is contracting irregularly with UI with no cervical change since office visit today not indicating active labor.  Patient denies any other complaints.  Based on this report provider has given order for discharge.  A discharge order and diagnosis entered by a provider.   Labor discharge instructions reviewed with patient.

## 2017-07-01 DIAGNOSIS — O99213 Obesity complicating pregnancy, third trimester: Secondary | ICD-10-CM | POA: Diagnosis not present

## 2017-07-01 DIAGNOSIS — Z3A37 37 weeks gestation of pregnancy: Secondary | ICD-10-CM | POA: Diagnosis not present

## 2017-07-02 ENCOUNTER — Other Ambulatory Visit: Payer: Self-pay | Admitting: Obstetrics

## 2017-07-02 ENCOUNTER — Telehealth (HOSPITAL_COMMUNITY): Payer: Self-pay | Admitting: *Deleted

## 2017-07-02 NOTE — Telephone Encounter (Signed)
Preadmission screen  

## 2017-07-08 DIAGNOSIS — Z3A38 38 weeks gestation of pregnancy: Secondary | ICD-10-CM | POA: Diagnosis not present

## 2017-07-08 DIAGNOSIS — O99213 Obesity complicating pregnancy, third trimester: Secondary | ICD-10-CM | POA: Diagnosis not present

## 2017-07-14 ENCOUNTER — Inpatient Hospital Stay (HOSPITAL_COMMUNITY)
Admission: RE | Admit: 2017-07-14 | Discharge: 2017-07-16 | DRG: 807 | Disposition: A | Discharge status: Home / Self Care | Payer: BLUE CROSS/BLUE SHIELD | Source: Ambulatory Visit | Attending: Obstetrics | Admitting: Obstetrics

## 2017-07-14 ENCOUNTER — Encounter (HOSPITAL_COMMUNITY): Payer: Self-pay

## 2017-07-14 ENCOUNTER — Inpatient Hospital Stay (HOSPITAL_COMMUNITY): Payer: BLUE CROSS/BLUE SHIELD | Admitting: Anesthesiology

## 2017-07-14 DIAGNOSIS — O99284 Endocrine, nutritional and metabolic diseases complicating childbirth: Secondary | ICD-10-CM | POA: Diagnosis not present

## 2017-07-14 DIAGNOSIS — O99214 Obesity complicating childbirth: Secondary | ICD-10-CM | POA: Diagnosis not present

## 2017-07-14 DIAGNOSIS — Z3A39 39 weeks gestation of pregnancy: Secondary | ICD-10-CM

## 2017-07-14 DIAGNOSIS — Z3490 Encounter for supervision of normal pregnancy, unspecified, unspecified trimester: Secondary | ICD-10-CM | POA: Diagnosis present

## 2017-07-14 DIAGNOSIS — Z3483 Encounter for supervision of other normal pregnancy, third trimester: Secondary | ICD-10-CM | POA: Diagnosis not present

## 2017-07-14 DIAGNOSIS — O134 Gestational [pregnancy-induced] hypertension without significant proteinuria, complicating childbirth: Secondary | ICD-10-CM | POA: Diagnosis not present

## 2017-07-14 DIAGNOSIS — E0590 Thyrotoxicosis, unspecified without thyrotoxic crisis or storm: Secondary | ICD-10-CM | POA: Diagnosis not present

## 2017-07-14 LAB — COMPREHENSIVE METABOLIC PANEL
ALBUMIN: 3.1 g/dL — AB (ref 3.5–5.0)
ALK PHOS: 151 U/L — AB (ref 38–126)
ALT: 10 U/L — AB (ref 14–54)
AST: 14 U/L — ABNORMAL LOW (ref 15–41)
Anion gap: 11 (ref 5–15)
BUN: 10 mg/dL (ref 6–20)
CALCIUM: 9.5 mg/dL (ref 8.9–10.3)
CO2: 20 mmol/L — AB (ref 22–32)
Chloride: 104 mmol/L (ref 101–111)
Creatinine, Ser: 0.58 mg/dL (ref 0.44–1.00)
GFR calc Af Amer: >60 mL/min (ref >60)
GFR calc non Af Amer: >60 mL/min (ref >60)
Glucose, Bld: 90 mg/dL (ref 65–99)
Potassium: 4.1 mmol/L (ref 3.5–5.1)
SODIUM: 135 mmol/L (ref 135–145)
Total Bilirubin: 0.2 mg/dL — ABNORMAL LOW (ref 0.3–1.2)
Total Protein: 6.4 g/dL — ABNORMAL LOW (ref 6.5–8.1)

## 2017-07-14 LAB — TYPE AND SCREEN
ABO/RH(D): O POS
Antibody Screen: NEGATIVE

## 2017-07-14 LAB — CBC
HEMATOCRIT: 37.1 % (ref 36.0–46.0)
Hemoglobin: 12.6 g/dL (ref 12.0–15.0)
MCH: 30.4 pg (ref 26.0–34.0)
MCHC: 34 g/dL (ref 30.0–36.0)
MCV: 89.6 fL (ref 78.0–100.0)
Platelets: 185 10*3/uL (ref 150–400)
RBC: 4.14 MIL/uL (ref 3.87–5.11)
RDW: 14.9 % (ref 11.5–15.5)
WBC: 11.5 10*3/uL — ABNORMAL HIGH (ref 4.0–10.5)

## 2017-07-14 LAB — PROTEIN / CREATININE RATIO, URINE
Creatinine, Urine: 86 mg/dL
Protein Creatinine Ratio: 0.12 mg/mg{Cre} (ref 0.00–0.15)
Total Protein, Urine: 10 mg/dL

## 2017-07-14 LAB — ABO/RH: ABO/RH(D): O POS

## 2017-07-14 LAB — RPR: RPR Ser Ql: NONREACTIVE

## 2017-07-14 LAB — URIC ACID: Uric Acid, Serum: 4.3 mg/dL (ref 2.3–6.6)

## 2017-07-14 MED ORDER — IBUPROFEN 600 MG PO TABS
600.0000 mg | ORAL_TABLET | Freq: Four times a day (QID) | ORAL | Status: DC
  Administered 2017-07-14 – 2017-07-16 (×8): 600 mg via ORAL
  Filled 2017-07-14 (×8): qty 1

## 2017-07-14 MED ORDER — MISOPROSTOL 200 MCG PO TABS
ORAL_TABLET | ORAL | Status: AC
  Filled 2017-07-14: qty 5

## 2017-07-14 MED ORDER — ONDANSETRON HCL 4 MG PO TABS: 4.0000 mg | ORAL_TABLET | ORAL | Status: DC | PRN

## 2017-07-14 MED ORDER — ONDANSETRON HCL 4 MG/2ML IJ SOLN: 4.0000 mg | INTRAMUSCULAR | Status: DC | PRN

## 2017-07-14 MED ORDER — SIMETHICONE 80 MG PO CHEW: 80.0000 mg | CHEWABLE_TABLET | ORAL | Status: DC | PRN

## 2017-07-14 MED ORDER — ACETAMINOPHEN 325 MG PO TABS: 650.0000 mg | ORAL_TABLET | ORAL | Status: DC | PRN

## 2017-07-14 MED ORDER — TERBUTALINE SULFATE 1 MG/ML IJ SOLN
0.2500 mg | Freq: Once | INTRAMUSCULAR | Status: DC | PRN
  Filled 2017-07-14: qty 1

## 2017-07-14 MED ORDER — DIPHENHYDRAMINE HCL 50 MG/ML IJ SOLN: 12.5000 mg | INTRAMUSCULAR | Status: DC | PRN

## 2017-07-14 MED ORDER — BENZOCAINE-MENTHOL 20-0.5 % EX AERO
1.0000 "application " | INHALATION_SPRAY | CUTANEOUS | Status: DC | PRN
  Administered 2017-07-14: 1 via TOPICAL
  Filled 2017-07-14 (×2): qty 56

## 2017-07-14 MED ORDER — LACTATED RINGERS IV SOLN: 500.0000 mL | INTRAVENOUS | Status: DC | PRN

## 2017-07-14 MED ORDER — SOD CITRATE-CITRIC ACID 500-334 MG/5ML PO SOLN
30.0000 mL | ORAL | Status: DC | PRN
  Filled 2017-07-14: qty 15

## 2017-07-14 MED ORDER — DIPHENHYDRAMINE HCL 25 MG PO CAPS: 25.0000 mg | ORAL_CAPSULE | Freq: Four times a day (QID) | ORAL | Status: DC | PRN

## 2017-07-14 MED ORDER — PHENYLEPHRINE 40 MCG/ML (10ML) SYRINGE FOR IV PUSH (FOR BLOOD PRESSURE SUPPORT)
80.0000 ug | PREFILLED_SYRINGE | INTRAVENOUS | Status: DC | PRN
  Filled 2017-07-14: qty 5

## 2017-07-14 MED ORDER — BUTORPHANOL TARTRATE 2 MG/ML IJ SOLN
2.0000 mg | INTRAMUSCULAR | Status: DC | PRN
  Administered 2017-07-14: 2 mg via INTRAVENOUS

## 2017-07-14 MED ORDER — PHENYLEPHRINE 40 MCG/ML (10ML) SYRINGE FOR IV PUSH (FOR BLOOD PRESSURE SUPPORT)
80.0000 ug | PREFILLED_SYRINGE | INTRAVENOUS | Status: DC | PRN
  Administered 2017-07-14: 80 ug via INTRAVENOUS
  Filled 2017-07-14: qty 5

## 2017-07-14 MED ORDER — FENTANYL 2.5 MCG/ML BUPIVACAINE 1/10 % EPIDURAL INFUSION (WH - ANES)
14.0000 mL/h | INTRAMUSCULAR | Status: DC | PRN
  Administered 2017-07-14: 14 mL/h via EPIDURAL

## 2017-07-14 MED ORDER — WITCH HAZEL-GLYCERIN EX PADS: 1.0000 "application " | MEDICATED_PAD | CUTANEOUS | Status: DC | PRN

## 2017-07-14 MED ORDER — PRENATAL MULTIVITAMIN CH
1.0000 | ORAL_TABLET | Freq: Every day | ORAL | Status: DC
  Administered 2017-07-15 – 2017-07-16 (×2): 1 via ORAL
  Filled 2017-07-14 (×2): qty 1

## 2017-07-14 MED ORDER — DIBUCAINE 1 % RE OINT: 1.0000 "application " | TOPICAL_OINTMENT | RECTAL | Status: DC | PRN

## 2017-07-14 MED ORDER — PHENYLEPHRINE 40 MCG/ML (10ML) SYRINGE FOR IV PUSH (FOR BLOOD PRESSURE SUPPORT)
PREFILLED_SYRINGE | INTRAVENOUS | Status: AC
  Filled 2017-07-14: qty 20

## 2017-07-14 MED ORDER — ZOLPIDEM TARTRATE 5 MG PO TABS: 5.0000 mg | ORAL_TABLET | Freq: Every evening | ORAL | Status: DC | PRN

## 2017-07-14 MED ORDER — ACETAMINOPHEN 325 MG PO TABS
650.0000 mg | ORAL_TABLET | ORAL | Status: DC | PRN
  Administered 2017-07-14: 650 mg via ORAL
  Filled 2017-07-14: qty 2

## 2017-07-14 MED ORDER — LACTATED RINGERS IV SOLN
INTRAVENOUS | Status: DC
  Administered 2017-07-14 (×2): via INTRAVENOUS

## 2017-07-14 MED ORDER — COCONUT OIL OIL: 1.0000 "application " | TOPICAL_OIL | Status: DC | PRN

## 2017-07-14 MED ORDER — LIDOCAINE HCL (PF) 1 % IJ SOLN
INTRAMUSCULAR | Status: DC | PRN
  Administered 2017-07-14: 13 mL via EPIDURAL

## 2017-07-14 MED ORDER — OXYTOCIN 40 UNITS IN LACTATED RINGERS INFUSION - SIMPLE MED
1.0000 m[IU]/min | INTRAVENOUS | Status: DC
  Administered 2017-07-14: 999 m[IU]/min via INTRAVENOUS
  Administered 2017-07-14: 2 m[IU]/min via INTRAVENOUS
  Filled 2017-07-14: qty 1000

## 2017-07-14 MED ORDER — LIDOCAINE HCL (PF) 1 % IJ SOLN
INTRAMUSCULAR | Status: AC
  Administered 2017-07-14: 30 mL
  Filled 2017-07-14: qty 30

## 2017-07-14 MED ORDER — EPHEDRINE 5 MG/ML INJ
10.0000 mg | INTRAVENOUS | Status: DC | PRN
  Filled 2017-07-14: qty 2

## 2017-07-14 MED ORDER — TETANUS-DIPHTH-ACELL PERTUSSIS 5-2.5-18.5 LF-MCG/0.5 IM SUSP: 0.5000 mL | Freq: Once | INTRAMUSCULAR | Status: DC

## 2017-07-14 MED ORDER — OXYCODONE HCL 5 MG PO TABS: 10.0000 mg | ORAL_TABLET | ORAL | Status: DC | PRN

## 2017-07-14 MED ORDER — SENNOSIDES-DOCUSATE SODIUM 8.6-50 MG PO TABS
2.0000 | ORAL_TABLET | ORAL | Status: DC
  Administered 2017-07-14 – 2017-07-16 (×2): 2 via ORAL
  Filled 2017-07-14 (×2): qty 2

## 2017-07-14 MED ORDER — OXYCODONE HCL 5 MG PO TABS: 5.0000 mg | ORAL_TABLET | ORAL | Status: DC | PRN

## 2017-07-14 MED ORDER — FENTANYL 2.5 MCG/ML BUPIVACAINE 1/10 % EPIDURAL INFUSION (WH - ANES)
INTRAMUSCULAR | Status: AC
  Filled 2017-07-14: qty 100

## 2017-07-14 MED ORDER — LACTATED RINGERS IV SOLN
500.0000 mL | Freq: Once | INTRAVENOUS | Status: AC
  Administered 2017-07-14: 500 mL via INTRAVENOUS

## 2017-07-14 MED ORDER — BUTORPHANOL TARTRATE 1 MG/ML IJ SOLN
INTRAMUSCULAR | Status: AC
  Filled 2017-07-14: qty 2

## 2017-07-14 MED ORDER — ONDANSETRON HCL 4 MG/2ML IJ SOLN: 4.0000 mg | Freq: Four times a day (QID) | INTRAMUSCULAR | Status: DC | PRN

## 2017-07-14 MED ORDER — MISOPROSTOL 200 MCG PO TABS
1000.0000 ug | ORAL_TABLET | Freq: Once | ORAL | Status: AC
  Administered 2017-07-14: 1000 ug via RECTAL

## 2017-07-14 NOTE — Anesthesia Preprocedure Evaluation (Signed)
Anesthesia Evaluation  Patient identified by MRN, date of birth, ID band Patient awake    Reviewed: Allergy & Precautions, H&P , NPO status , Patient's Chart, lab work & pertinent test results  Airway Mallampati: II  TM Distance: >3 FB Neck ROM: Full    Dental no notable dental hx.    Pulmonary neg pulmonary ROS,    Pulmonary exam normal breath sounds clear to auscultation       Cardiovascular negative cardio ROS Normal cardiovascular exam Rhythm:Regular Rate:Normal     Neuro/Psych    GI/Hepatic negative GI ROS, Neg liver ROS, GERD  ,  Endo/Other  Morbid obesity  Renal/GU      Musculoskeletal   Abdominal   Peds  Hematology   Anesthesia Other Findings   Reproductive/Obstetrics (+) Pregnancy                             Anesthesia Physical  Anesthesia Plan  ASA: III  Anesthesia Plan: Epidural   Post-op Pain Management:    Induction:   PONV Risk Score and Plan:   Airway Management Planned:   Additional Equipment:   Intra-op Plan:   Post-operative Plan:   Informed Consent: I have reviewed the patients History and Physical, chart, labs and discussed the procedure including the risks, benefits and alternatives for the proposed anesthesia with the patient or authorized representative who has indicated his/her understanding and acceptance.   Dental advisory given  Plan Discussed with: CRNA, Anesthesiologist and Surgeon  Anesthesia Plan Comments:         Anesthesia Quick Evaluation

## 2017-07-14 NOTE — Anesthesia Procedure Notes (Signed)
Epidural Patient location during procedure: OB Start time: 07/14/2017 1:27 PM End time: 07/14/2017 1:42 PM  Staffing Anesthesiologist: Lowella Curb, MD Performed: anesthesiologist   Preanesthetic Checklist Completed: patient identified, site marked, surgical consent, pre-op evaluation, timeout performed, IV checked, risks and benefits discussed and monitors and equipment checked  Epidural Patient position: sitting Prep: ChloraPrep Patient monitoring: heart rate, cardiac monitor, continuous pulse ox and blood pressure Approach: midline Location: L2-L3 Injection technique: LOR saline  Needle:  Needle type: Tuohy  Needle gauge: 17 G Needle length: 9 cm Needle insertion depth: 8 cm Catheter type: closed end flexible Catheter size: 20 Guage Catheter at skin depth: 13 cm Test dose: negative  Assessment Events: blood not aspirated, injection not painful, no injection resistance, negative IV test and no paresthesia  Additional Notes Reason for block:procedure for pain

## 2017-07-14 NOTE — H&P (Signed)
Amanda Hicks is a 22 y.o. G1P0 at [redacted]w[redacted]d presenting for elective IOL. Pt notes intermittient contractions. Good fetal movement, No vaginal bleeding, not leaking fluid.  PNCare at [facility] since 6 wks - Dated by LMP c/w 6 wk u/s - obesity, on metformin for PCOS at start of preg - tachycardia, unexplained, s/p cards consult - subclinical hyperthyroid, frequent labs, no meds, nl free T3/T4 - preterm dilation at 35 wks, s/p BMZ, no tocolytics   Prenatal Transfer Tool  Maternal Diabetes: No Genetic Screening: Normal Maternal Ultrasounds/Referrals: Normal Fetal Ultrasounds or other Referrals:  None Maternal Substance Abuse:  No Significant Maternal Medications:  None Significant Maternal Lab Results: None     OB History    Gravida  1   Para      Term      Preterm      AB      Living        SAB      TAB      Ectopic      Multiple      Live Births             Past Medical History:  Diagnosis Date  . Dizziness   . GERD (gastroesophageal reflux disease)   . Hyperemesis affecting pregnancy, antepartum   . Low TSH level   . Nasal congestion related to pregnancy   . Obesity in pregnancy   . PCOS (polycystic ovarian syndrome)   . Polycystic ovaries   . Tachycardia   . Tonsillitis 07/2012  . URI, acute   . Vomiting blood    Past Surgical History:  Procedure Laterality Date  . OOPHORECTOMY Left 02/2012  . TONSILLECTOMY N/A 08/24/2012   Procedure: TONSILLECTOMY and ADENOIDECTOMY;  Surgeon: Drema Halon, MD;  Location: [facility];  Service: ENT;  Laterality: N/A;   Family History: family history includes Diabetes Mellitus I in her father; Hypertension in her mother. Social History:  reports that she has never smoked. She has never used smokeless tobacco. She reports that she does not drink alcohol or use drugs.  Review of Systems - Negative except discomfort of pregnancy   Dilation: 4.5 Effacement (%): 80 Station: -2 Exam by::  DManson Passey RNC Blood pressure (!) 143/88, pulse (!) 104, temperature 98.3 F (36.8 C), temperature source Oral, resp. rate 18, height  (1.626 m), weight 125.6 kg (277 lb).    Prenatal labs: ABO, Rh: O/Positive/-- (10/31 0000) Antibody: Negative (10/31 0000) Rubella:  immune RPR: Nonreactive (10/31 0000)  HBsAg: Negative (10/31 0000)  HIV: Non-reactive (10/31 0000)  GBS: Negative (04/22 0000)  1 hr Glucola 121  Genetic screening nl quad Anatomy US notmal   Assessment/Plan: 22 y.o. G1P0 at [redacted]w[redacted]d Elective IOL, plan pitocin, AROM when able R/B IOL d/w pt GBS neg   Lendon Colonel 07/14/2017, 7:54 AM

## 2017-07-14 NOTE — Anesthesia Pain Management Evaluation Note (Signed)
  CRNA Pain Management Visit Note  Patient: Amanda Hicks, 22 y.o., female  "Hello I am a member of the anesthesia team at [facility]. We have an anesthesia team available at all times to provide care throughout the hospital, including epidural management and anesthesia for C-section. I don't know your plan for the delivery whether it a natural birth, water birth, IV sedation, nitrous supplementation, doula or epidural, but we want to meet your pain goals."   1.Was your pain managed to your expectations on prior hospitalizations?   No prior hospitalizations  2.What is your expectation for pain management during this hospitalization?     IV pain meds  3.How can we help you reach that goal? Nursing interventions.  Record the patient's initial score and the patient's pain goal.   Pain: 0  Pain Goal: 7 [facility] wants you to be able to say your pain was always managed very well.  Island Dohmen 07/14/2017

## 2017-07-14 NOTE — Progress Notes (Signed)
Pt s/p iv pain relief, sleeping and more comfortable Denies any h/a, vision changes, ruq pain  Patient Vitals for the past 24 hrs:  BP Temp Temp src Pulse Resp Height Weight  07/14/17 1155 130/73 98.1 F (36.7 C) Oral 80 18 - -  07/14/17 1122 (!) 150/93 - - 86 - - -  07/14/17 1045 (!) 144/96 - - 89 - - -  07/14/17 1000 138/89 - - (!) 102 20 - -  07/14/17 0930 138/84 - - (!) 103 - - -  07/14/17 0900 129/75 - - (!) 101 20 - -  07/14/17 0830 136/87 - - 100 - - -  07/14/17 0800 (!) 118/100 - - 91 18 - -  07/14/17 0716 (!) 143/88 98.3 F (36.8 C) Oral (!) 104 18  (1.626 m) 277 lb (125.6 kg)   A&ox3 nml respirations Abd: soft, nt, gravid Cx: 5/90/-2; arom with clear fluid LE: +1 le edema, nt bilat  Results for orders placed or performed during the hospital encounter of 07/14/17 (from the past 24 hour(s))  CBC     Status: Abnormal   Collection Time: 07/14/17  7:35 AM  Result Value Ref Range   WBC 11.5 (H) 4.0 - 10.5 K/uL   RBC 4.14 3.87 - 5.11 MIL/uL   Hemoglobin 12.6 12.0 - 15.0 g/dL   HCT 16.1 09.6 - 04.5 %   MCV 89.6 78.0 - 100.0 fL   MCH 30.4 26.0 - 34.0 pg   MCHC 34.0 30.0 - 36.0 g/dL   RDW 40.9 81.1 - 91.4 %   Platelets 185 150 - 400 K/uL  Type and screen     Status: None   Collection Time: 07/14/17  7:35 AM  Result Value Ref Range   ABO/RH(D) O POS    Antibody Screen NEG    Sample Expiration      07/17/2017 Performed at [facility], [address], [place], [place] [address]   Protein / creatinine ratio, urine     Status: None   Collection Time: 07/14/17 11:46 AM  Result Value Ref Range   Creatinine, Urine 86.00 mg/dL   Total Protein, Urine 10 mg/dL   Protein Creatinine Ratio 0.12 0.00 - 0.15 mg/mg[Cre]  Uric acid     Status: None   Collection Time: 07/14/17 12:01 PM  Result Value Ref Range   Uric Acid, Serum 4.3 2.3 - 6.6 mg/dL  Comprehensive metabolic panel     Status: Abnormal   Collection Time: 07/14/17 12:01 PM  Result Value Ref Range   Sodium  135 135 - 145 mmol/L   Potassium 4.1 3.5 - 5.1 mmol/L   Chloride 104 101 - 111 mmol/L   CO2 20 (L) 22 - 32 mmol/L   Glucose, Bld 90 65 - 99 mg/dL   BUN 10 6 - 20 mg/dL   Creatinine, Ser 6.21 0.44 - 1.00 mg/dL   Calcium 9.5 8.9 - 30.8 mg/dL   Total Protein 6.4 (L) 6.5 - 8.1 g/dL   Albumin 3.1 (L) 3.5 - 5.0 g/dL   AST 14 (L) 15 - 41 U/L   ALT 10 (L) 14 - 54 U/L   Alkaline Phosphatase 151 (H) 38 - 126 U/L   Total Bilirubin 0.2 (L) 0.3 - 1.2 mg/dL   GFR calc non Af Amer >60 >60 mL/min   GFR calc Af Amer >60 >60 mL/min   Anion gap 11 5 - 15   FHT: 120s, 130s, nml variability; +accels, no decels, rare slight variabile TOCO: irregular,  q 2-3  A/p: iup at 39.2 1. Elective IOL - contin pitocoin; plan svd 2. Fetal status reassuring 3. gbs neg 4. Rh pos 5. Elevated bps - asymptomatic and pih labs wnl; follow and may improve with pain relief;may consider epidural though iv pain meds desired not 6. Obese 7. Subclinical hyperthyroid

## 2017-07-15 LAB — CBC
HCT: 29.9 % — ABNORMAL LOW (ref 36.0–46.0)
Hemoglobin: 10.1 g/dL — ABNORMAL LOW (ref 12.0–15.0)
MCH: 30.1 pg (ref 26.0–34.0)
MCHC: 33.8 g/dL (ref 30.0–36.0)
MCV: 89 fL (ref 78.0–100.0)
PLATELETS: 133 10*3/uL — AB (ref 150–400)
RBC: 3.36 MIL/uL — AB (ref 3.87–5.11)
RDW: 15 % (ref 11.5–15.5)
WBC: 13.1 10*3/uL — ABNORMAL HIGH (ref 4.0–10.5)

## 2017-07-15 NOTE — Lactation Note (Signed)
This note was copied from a baby's chart. Lactation Consultation Note  Patient Name: Girl Retal Tonkinson [id number] Date: 07/15/2017 Reason for consult: Initial assessment;Primapara;Term  Breastfeeding consultation services and support information given and reviewed.  This is mom's first baby and newborn is 73 hours old.  Baby is starting to cluster feed.  Observed a good latch with audible swallows.  Assisted mom with turning infant towards her.  Basic teaching done.  No questions or concerns at present time.  Encouraged to call for assist/concerns.  Maternal Data Does the patient have breastfeeding experience prior to this delivery?: No  Feeding Feeding Type: Breast Fed  LATCH Score Latch: Grasps breast easily, tongue down, lips flanged, rhythmical sucking.  Audible Swallowing: Spontaneous and intermittent  Type of Nipple: Everted at rest and after stimulation  Comfort (Breast/Nipple): Soft / non-tender  Hold (Positioning): Assistance needed to correctly position infant at breast and maintain latch.  LATCH Score: 9  Interventions Interventions: Breast feeding basics reviewed  Lactation Tools Discussed/Used     Consult Status Consult Status: Follow-up Date: 07/16/17 Follow-up type: In-patient    Huston Foley 07/15/2017, 10:17 AM

## 2017-07-15 NOTE — Anesthesia Postprocedure Evaluation (Signed)
Anesthesia Post Note  Patient: Amanda Hicks  Procedure(s) Performed: AN AD HOC LABOR EPIDURAL     Patient location during evaluation: Mother Baby Anesthesia Type: Epidural Level of consciousness: awake and alert and oriented Pain management: satisfactory to patient Vital Signs Assessment: post-procedure vital signs reviewed and stable Respiratory status: respiratory function stable Cardiovascular status: stable Postop Assessment: no headache, no backache, epidural receding, patient able to bend at knees, no signs of nausea or vomiting and adequate PO intake Anesthetic complications: no    Last Vitals:  Vitals:   07/14/17 2233 07/15/17 0500  BP: 134/76 118/78  Pulse: 81 89  Resp: 18 18  Temp: 37.2 C 36.6 C  SpO2: 99%     Last Pain:  Vitals:   07/14/17 2233  TempSrc: Oral  PainSc: 0-No pain   Pain Goal: Patients Stated Pain Goal: 3 (07/14/17 1810)               Karleen Dolphin

## 2017-07-15 NOTE — Progress Notes (Signed)
PPD 1 SVD  S:  Reports feeling well - just tired             Tolerating po/ No nausea or vomiting             Bleeding is moderate             Pain controlled with Motrin             Up ad lib / ambulatory / voiding QS  Newborn Breast  O:               VS: BP 118/78   Pulse 89   Temp 97.9 F (36.6 C)   Resp 18   Ht  (1.626 m)   Wt 125.6 kg (277 lb)   SpO2 99%   Breastfeeding? Unknown   BMI 47.55 kg/m    LABS:              Recent Labs    07/14/17 0735 07/15/17 0604  WBC 11.5* 13.1*  HGB 12.6 10.1*  PLT 185 133*               Blood type: --/--/O POS (05/21 0981)                I&O: Intake/Output      05/21 0701 - 05/22 0700 05/22 0701 - 05/23 0700   Urine (mL/kg/hr) 200 (0.1)    Blood 600    Total Output 800    Net -800         Urine Occurrence 1 x                  Physical Exam:             Alert and oriented X3  Abdomen: soft, non-tender, non-distended, pendulous panus             Fundus: firm, non-tender, Ueven  Perineum: ice pack in place  Lochia: moderate  Extremities: 1+ edema, no calf pain or tenderness  A: PPD # 1   Doing well - stable status  P: Routine post partum orders   Marlinda Mike CNM, MSN, [facility] 07/15/2017, 8:43 AM

## 2017-07-16 MED ORDER — IBUPROFEN 600 MG PO TABS: 600.0000 mg | ORAL_TABLET | Freq: Four times a day (QID) | ORAL | 0 refills | Status: DC

## 2017-07-16 NOTE — Progress Notes (Signed)
PPD 2 SVD  S:  Reports feeling well - no sleep with clustering / more cramping with feedings             Tolerating po/ No nausea or vomiting             Bleeding is moderate             Pain controlled with motrin             Up ad lib / ambulatory / voiding QS  Newborn Breast / female  O:    VS: BP 125/89   Pulse 77   Temp 98 F (36.7 C) (Oral)   Resp 20   Ht  (1.626 m)   Wt 125.6 kg (277 lb)   SpO2 99%   Breastfeeding? Unknown   BMI 47.55 kg/m               Physical Exam:             Alert and oriented X3  Abdomen: soft, non-tender, non-distended              Fundus: firm, non-tender, Ueven  Perineum: no edema  Lochia: light  Extremities: 1+ pedal edema, no calf pain or tenderness  A: PPD # 2   Doing well - stable status  P: Routine post partum orders  DC HOME - WOB booklet - instructions reveiwed  Marlinda Mike CNM, MSN, [facility] 07/16/2017, 7:14 AM

## 2017-07-16 NOTE — Lactation Note (Addendum)
This note was copied from a baby's chart. Lactation Consultation Note 59 hrs old mom states she hasn't seen a void. Asked mom to save diapers and call for RN to assess for voids. Reminded of blue stripe in diaper when voids. Baby has had 4 stools. Mom has DEBP at bedside, hasn't used it. Encouraged to pump to build milk supply and if she collected any colostrum to give to baby. Reminded mom normal not to pump anything, doing mainly for stimulation. Mom has PCOS.  Patient Name: Amanda Hicks Today's Date: 07/16/2017     Maternal Data    Feeding Feeding Type: Bottle Fed - Formula Nipple Type: Slow - flow Length of feed: 15 min  LATCH Score Latch: Grasps breast easily, tongue down, lips flanged, rhythmical sucking.  Audible Swallowing: A few with stimulation  Type of Nipple: Everted at rest and after stimulation  Comfort (Breast/Nipple): Filling, red/small blisters or bruises, mild/mod discomfort  Hold (Positioning): Assistance needed to correctly position infant at breast and maintain latch.  LATCH Score: 7  Interventions Interventions: Breast feeding basics reviewed;Assisted with latch;Skin to skin;Position options;Support pillows;Hand pump;DEBP  Lactation Tools Discussed/Used     Consult Status      Charyl Dancer 07/16/2017, 12:18 AM

## 2017-07-16 NOTE — Lactation Note (Signed)
This note was copied from a baby's chart. Lactation Consultation Note: Mother reports that infant is breastfeeding well. Mother denies having any discomforts on her nipple. She reports that she is able to hand express colostrum and she has pumped 3 ml.  Mother is cue base feeding and she is supplementing infant with formula up to 30 ml after feeding.  Discussed importance of consistent pumping after each feeding at least 6-8 times daily,  to provide a good milk supply. Mother reports that she did have good breast changes in pregnancy. Mother advised in frequent skin to skin and feed infant 8-12 times in 24 hours. Discussed cluster feeding as a normal newborn behavior. Advised mother to do good breast massage and ice if she becomes engorged. Encouraged mother to follow up with [facility] and do a pre and post weight assessment. Mother is aware of all available LC services. Mother has a PIS at home. She was also given a hand pump.  Mother denies having any questions about breastfeeding.   Patient Name: Girl Saleen Peden [id number] Date: 07/16/2017 Reason for consult: Follow-up assessment   Maternal Data    Feeding Feeding Type: Bottle Fed - Formula Length of feed: 12 min  LATCH Score                   Interventions    Lactation Tools Discussed/Used     Consult Status Consult Status: Complete    Michel Bickers 07/16/2017, 11:09 AM

## 2017-07-16 NOTE — Discharge Summary (Signed)
Obstetric Discharge Summary Reason for Admission: induction of labor - elective Prenatal Procedures: ultrasound and Preterm labor OBS Intrapartum Procedures: spontaneous vaginal delivery and epidural Postpartum Procedures: Rho(D) Ig Complications-Operative and Postpartum: 2nd degree perineal laceration Hemoglobin  Date Value Ref Range Status  07/15/2017 10.1 (L) 12.0 - 15.0 g/dL Final   HCT  Date Value Ref Range Status  07/15/2017 29.9 (L) 36.0 - 46.0 % Final    Physical Exam:  General: alert, cooperative and no distress Lochia: appropriate Uterine Fundus: firm Incision: healing well DVT Evaluation: No evidence of DVT seen on physical exam.  Discharge Diagnoses: Term Pregnancy-delivered  Discharge Information: Date: 07/16/2017 Activity: pelvic rest Diet: routine Medications: PNV and Ibuprofen Condition: stable Instructions: refer to practice specific booklet Discharge to: home Follow-up Information    Noland Fordyce, MD. Schedule an appointment as soon as possible for a visit in 6 week(s).   Specialty:  Obstetrics and Gynecology Contact information: [address] [place] [address] [phone]           Newborn Data: Live born female  Birth Weight: 8 lb 2.2 oz (3690 g) APGAR: 8, 9  Newborn Delivery   Birth date/time:  [birth year]/14/2017 14:35:00 Delivery type:  Vaginal, Spontaneous     Home with mother.  Amanda Hicks 07/16/2017, 8:21 AM

## 2017-08-26 DIAGNOSIS — R7989 Other specified abnormal findings of blood chemistry: Secondary | ICD-10-CM | POA: Diagnosis not present

## 2017-09-01 DIAGNOSIS — R1031 Right lower quadrant pain: Secondary | ICD-10-CM | POA: Diagnosis not present

## 2017-09-22 DIAGNOSIS — Z23 Encounter for immunization: Secondary | ICD-10-CM | POA: Diagnosis not present

## 2018-02-15 DIAGNOSIS — J3089 Other allergic rhinitis: Secondary | ICD-10-CM | POA: Diagnosis not present

## 2018-02-15 DIAGNOSIS — G44011 Episodic cluster headache, intractable: Secondary | ICD-10-CM | POA: Diagnosis not present

## 2018-03-09 DIAGNOSIS — J111 Influenza due to unidentified influenza virus with other respiratory manifestations: Secondary | ICD-10-CM | POA: Diagnosis not present

## 2018-03-09 DIAGNOSIS — R05 Cough: Secondary | ICD-10-CM | POA: Diagnosis not present

## 2018-05-10 DIAGNOSIS — J029 Acute pharyngitis, unspecified: Secondary | ICD-10-CM | POA: Diagnosis not present

## 2018-09-15 DIAGNOSIS — Z3200 Encounter for pregnancy test, result unknown: Secondary | ICD-10-CM | POA: Diagnosis not present

## 2018-11-12 DIAGNOSIS — Z0000 Encounter for general adult medical examination without abnormal findings: Secondary | ICD-10-CM | POA: Diagnosis not present

## 2018-11-12 DIAGNOSIS — Z131 Encounter for screening for diabetes mellitus: Secondary | ICD-10-CM | POA: Diagnosis not present

## 2018-11-12 DIAGNOSIS — Z6841 Body Mass Index (BMI) 40.0 and over, adult: Secondary | ICD-10-CM | POA: Diagnosis not present

## 2018-11-12 DIAGNOSIS — H6121 Impacted cerumen, right ear: Secondary | ICD-10-CM | POA: Diagnosis not present

## 2019-01-19 DIAGNOSIS — Z3200 Encounter for pregnancy test, result unknown: Secondary | ICD-10-CM | POA: Diagnosis not present

## 2019-02-06 ENCOUNTER — Other Ambulatory Visit: Payer: Self-pay

## 2019-02-06 ENCOUNTER — Ambulatory Visit (HOSPITAL_COMMUNITY)
Admission: EM | Admit: 2019-02-06 | Discharge: 2019-02-06 | Disposition: A | Discharge status: Home / Self Care | Payer: BC Managed Care – PPO | Attending: Emergency Medicine | Admitting: Emergency Medicine

## 2019-02-06 ENCOUNTER — Encounter (HOSPITAL_COMMUNITY): Payer: Self-pay

## 2019-02-06 DIAGNOSIS — Z20828 Contact with and (suspected) exposure to other viral communicable diseases: Secondary | ICD-10-CM | POA: Diagnosis not present

## 2019-02-06 DIAGNOSIS — Z833 Family history of diabetes mellitus: Secondary | ICD-10-CM | POA: Diagnosis not present

## 2019-02-06 DIAGNOSIS — Z3202 Encounter for pregnancy test, result negative: Secondary | ICD-10-CM | POA: Diagnosis not present

## 2019-02-06 DIAGNOSIS — R519 Headache, unspecified: Secondary | ICD-10-CM | POA: Diagnosis present

## 2019-02-06 DIAGNOSIS — Z791 Long term (current) use of non-steroidal anti-inflammatories (NSAID): Secondary | ICD-10-CM | POA: Diagnosis not present

## 2019-02-06 DIAGNOSIS — Z6841 Body Mass Index (BMI) 40.0 and over, adult: Secondary | ICD-10-CM | POA: Insufficient documentation

## 2019-02-06 DIAGNOSIS — Z90721 Acquired absence of ovaries, unilateral: Secondary | ICD-10-CM | POA: Diagnosis not present

## 2019-02-06 DIAGNOSIS — R509 Fever, unspecified: Secondary | ICD-10-CM

## 2019-02-06 LAB — CBC WITH DIFFERENTIAL/PLATELET
Abs Immature Granulocytes: 0.05 10*3/uL (ref 0.00–0.07)
Basophils Absolute: 0.1 10*3/uL (ref 0.0–0.1)
Basophils Relative: 1 %
Eosinophils Absolute: 0.4 10*3/uL (ref 0.0–0.5)
Eosinophils Relative: 6 %
HCT: 45.6 % (ref 36.0–46.0)
Hemoglobin: 15.4 g/dL — ABNORMAL HIGH (ref 12.0–15.0)
Immature Granulocytes: 1 %
Lymphocytes Relative: 43 %
Lymphs Abs: 3 10*3/uL (ref 0.7–4.0)
MCH: 29.3 pg (ref 26.0–34.0)
MCHC: 33.8 g/dL (ref 30.0–36.0)
MCV: 86.9 fL (ref 80.0–100.0)
Monocytes Absolute: 0.4 10*3/uL (ref 0.1–1.0)
Monocytes Relative: 6 %
Neutro Abs: 3 10*3/uL (ref 1.7–7.7)
Neutrophils Relative %: 43 %
Platelets: 186 10*3/uL (ref 150–400)
RBC: 5.25 MIL/uL — ABNORMAL HIGH (ref 3.87–5.11)
RDW: 13.3 % (ref 11.5–15.5)
WBC: 7 10*3/uL (ref 4.0–10.5)
nRBC: 0 % (ref 0.0–0.2)

## 2019-02-06 LAB — POCT URINALYSIS DIP (DEVICE)
Glucose, UA: NEGATIVE mg/dL
Ketones, ur: NEGATIVE mg/dL
Leukocytes,Ua: NEGATIVE
Nitrite: NEGATIVE
Protein, ur: NEGATIVE mg/dL
Specific Gravity, Urine: >=1.03 (ref 1.005–1.030)
Urobilinogen, UA: 1 mg/dL (ref 0.0–1.0)
pH: 5.5 (ref 5.0–8.0)

## 2019-02-06 LAB — POCT PREGNANCY, URINE: Preg Test, Ur: NEGATIVE

## 2019-02-06 LAB — POC URINE PREG, ED: Preg Test, Ur: NEGATIVE

## 2019-02-06 NOTE — Discharge Instructions (Addendum)
I am still suspicious of covid-19 with your described symptoms and your exam.  Your urine looks well but you do appear dehydrated, likely from having fevers, increase your fluid intake.  I would recommend isolation until you have been fever free for 3 days, or for 10 days from onset of symptoms, whichever is longer, as per CDC guidelines. We will call you with any concerning lab test or positive lab findings.  Rest.  Push fluids to ensure adequate hydration and keep secretions thin.  Tylenol and/or ibuprofen as needed for pain or fevers.  Please return or go to the ER for any worsening of symptoms, chest pain , shortness of breath , confusion, severe headache.

## 2019-02-06 NOTE — ED Triage Notes (Signed)
Pt presents with intermittent headache and fever for over a week.  Pt states she was tested for covid and flu on Monday and both were negative.

## 2019-02-06 NOTE — ED Provider Notes (Signed)
[facility]    CSN: [id number] Arrival date & time: 02/06/19  1006      History   Chief Complaint Chief Complaint  Patient presents with  . Headache  . Fever    HPI Amanda Hicks is a 23 y.o. female.   Amanda Hicks presents with complaints of symtoms which started 1 week ago. Temp of approximately 100.1 initially. Tested for covid-19 the following day as well as flu which were both negative. Temp remained in the 99's and occasionally up to 100. 12/9 fever improved. Headache's was taking migraine medicine which did seem to help some. 12/10 without fever during the day but then temp again of 100.6. last night temp of 102. Temps seem to be at night. Feels ok in the morning. Still with headache currently, pain 7/10, to top of head, varies location of pain however. Slight cough. No congestion. No runny nose. No sore throat although can feel dry at times. Ear pain to left. Threw up one week ago, no further nausea vomiting or diarrhea, and has been eating and drinking since. Yesterday hadn't taken any medicine for fever or pain. Hasn't taken any medication today. Daughter has had a cough for a month but otherwise has been well. Her husband started to get a cough a few days ago. Denies any specific urinary symptoms but has noted occasional sharp vaginal pain. She works at a daycare. History  Of PCOS.    ROS per HPI, negative if not otherwise mentioned.      Past Medical History:  Diagnosis Date  . Dizziness   . GERD (gastroesophageal reflux disease)   . Hyperemesis affecting pregnancy, antepartum   . Low TSH level   . Nasal congestion related to pregnancy   . Obesity in pregnancy   . PCOS (polycystic ovarian syndrome)   . Polycystic ovaries   . Tachycardia   . Tonsillitis 07/2012  . URI, acute   . Vomiting blood     Patient Active Problem List   Diagnosis Date Noted  . Encounter for planned induction of labor 07/14/2017  . SVD (spontaneous vaginal delivery)  07/14/2017  . Postpartum care following vaginal delivery (5/21) 07/14/2017  . Preterm labor 06/16/2017  . 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 PCOS (polycystic ovarian syndrome) 03/04/2016  . Insomnia 10/25/2010  . Morbid obesity with BMI of 45.0-49.9, adult (HCC) 10/25/2010    Past Surgical History:  Procedure Laterality Date  . OOPHORECTOMY Left 02/2012  . TONSILLECTOMY N/A 08/24/2012   Procedure: TONSILLECTOMY and ADENOIDECTOMY;  Surgeon: Drema Halonhristopher E Newman, MD;  Location: [facility];  Service: ENT;  Laterality: N/A;    OB History    Gravida  1   Para  1   Term  1   Preterm      AB      Living  1     SAB      TAB      Ectopic      Multiple  0   Live Births  1            Home Medications    Prior to Admission medications   Medication Sig Start Date End Date Taking? Authorizing Provider  acetaminophen (TYLENOL) 325 MG tablet Take 2 tablets (650 mg total) by mouth every 4 (four) hours as needed (for pain scale < 4  OR  temperature  >/=  100.5 F). 06/17/17   Shea EvansMody, Vaishali, MD  ibuprofen (ADVIL,MOTRIN) 600 MG  tablet Take 1 tablet (600 mg total) by mouth every 6 (six) hours. 07/16/17   Marlinda Mike, CNM  Prenatal Vit-Fe Fumarate-FA (MULTIVITAMIN-PRENATAL) 27-0.8 MG TABS tablet Take 1 tablet by mouth daily at 12 noon.    [provider]    Family History Family History  Problem Relation Age of Onset  . Hypertension Mother   . Diabetes Mellitus I Father     Social History Social History   Tobacco Use  . Smoking status: Never Smoker  . Smokeless tobacco: Never Used  Substance Use Topics  . Alcohol use: No  . Drug use: No     Allergies   Patient has no known allergies.   Review of Systems Review of Systems   Physical Exam Triage Vital Signs ED Triage Vitals  Enc Vitals Group     BP 02/06/19 1030 118/74     Pulse Rate 02/06/19 1030 (!) 107     Resp 02/06/19 1030 17     Temp 02/06/19  1030 99.4 F (37.4 C)     Temp Source 02/06/19 1030 Oral     SpO2 02/06/19 1030 99 %     Weight --      Height --      Head Circumference --      Peak Flow --      Pain Score 02/06/19 1031 6     Pain Loc --      Pain Edu? --      Excl. in GC? --    No data found.  Updated Vital Signs BP 118/74 (BP Location: Left Arm)   Pulse (!) 107   Temp 99.4 F (37.4 C) (Oral)   Resp 17   LMP 02/02/2019   SpO2 99%   Visual Acuity Right Eye Distance:   Left Eye Distance:   Bilateral Distance:    Right Eye Near:   Left Eye Near:    Bilateral Near:     Physical Exam Constitutional:      General: She is not in acute distress.    Appearance: She is well-developed.  HENT:     Head: Normocephalic and atraumatic.     Right Ear: Tympanic membrane and ear canal normal.     Left Ear: Tympanic membrane and ear canal normal.  Eyes:     Extraocular Movements: Extraocular movements intact.  Cardiovascular:     Rate and Rhythm: Tachycardia present.  Pulmonary:     Effort: Pulmonary effort is normal.  Skin:    General: Skin is warm and dry.  Neurological:     Mental Status: She is alert and oriented to person, place, and time.     Cranial Nerves: No cranial nerve deficit or facial asymmetry.  Psychiatric:        Speech: Speech normal.      UC Treatments / Results  Labs (all labs ordered are listed, but only abnormal results are displayed) Labs Reviewed  POCT URINALYSIS DIP (DEVICE) - Abnormal; Notable for the following components:      Result Value   Bilirubin Urine SMALL (*)    Hgb urine dipstick MODERATE (*)    All other components within normal limits  NOVEL CORONAVIRUS, NAA (HOSP ORDER, SEND-OUT TO REF LAB; TAT 18-24 HRS)  CBC WITH DIFFERENTIAL/PLATELET  POC URINE PREG, ED  POCT PREGNANCY, URINE  CERVICOVAGINAL ANCILLARY ONLY    EKG   Radiology No results found.  Procedures Procedures (including critical care time)  Medications Ordered in UC Medications - No  data to display  Initial Impression / Assessment and Plan / UC Course  I have reviewed the triage vital signs and the nursing notes.  Pertinent labs & imaging results that were available during my care of the patient were reviewed by me and considered in my medical decision making (see chart for details).     Non toxic. Benign physical exam.  Mild tachycardia noted. Urine appears with dehydration with elevated specfic gravity. hgb noted to urine without leuks. No current fever, hasn't taken any medications today. 1 week of headache and intermittent fevers, occasional cough. Husband with new onset cough a few days after her symptoms had started, he has not had covid testing. Will repeat her covid-19 pcr today as this still is appearing likely. Will check urine and vaginal cytology as with some vaginal discomfort. Baseline CBC obtained as well. Encouraged continued isolation to complete 10 days and will need to be fever free for three days. Return precautions provided. Patient verbalized understanding and agreeable to plan.   Final Clinical Impressions(s) / UC Diagnoses   Final diagnoses:  Fever, unspecified fever cause  Acute nonintractable headache, unspecified headache type     Discharge Instructions     I am still suspicious of covid-19 with your described symptoms and your exam.  Your urine looks well but you do appear dehydrated, likely from having fevers, increase your fluid intake.  I would recommend isolation until you have been fever free for 3 days, or for 10 days from onset of symptoms, whichever is longer, as per CDC guidelines. We will call you with any concerning lab test or positive lab findings.  Rest.  Push fluids to ensure adequate hydration and keep secretions thin.  Tylenol and/or ibuprofen as needed for pain or fevers.  Please return or go to the ER for any worsening of symptoms, chest pain , shortness of breath , confusion, severe headache.     ED Prescriptions      None     PDMP not reviewed this encounter.   Zigmund Gottron, NP 02/06/19 1121

## 2019-02-07 LAB — NOVEL CORONAVIRUS, NAA (HOSP ORDER, SEND-OUT TO REF LAB; TAT 18-24 HRS): SARS-CoV-2, NAA: NOT DETECTED

## 2019-02-07 LAB — CERVICOVAGINAL ANCILLARY ONLY
Bacterial vaginitis: NEGATIVE
Candida vaginitis: NEGATIVE

## 2019-12-22 ENCOUNTER — Inpatient Hospital Stay (HOSPITAL_COMMUNITY)
Admission: AD | Admit: 2019-12-22 | Discharge: 2019-12-23 | Disposition: A | Discharge status: Home / Self Care | Payer: BC Managed Care – PPO | Attending: Obstetrics and Gynecology | Admitting: Obstetrics and Gynecology

## 2019-12-22 ENCOUNTER — Encounter (HOSPITAL_COMMUNITY): Payer: Self-pay | Admitting: Obstetrics and Gynecology

## 2019-12-22 ENCOUNTER — Other Ambulatory Visit: Payer: Self-pay

## 2019-12-22 ENCOUNTER — Inpatient Hospital Stay (HOSPITAL_COMMUNITY): Payer: BC Managed Care – PPO

## 2019-12-22 DIAGNOSIS — Z3A01 Less than 8 weeks gestation of pregnancy: Secondary | ICD-10-CM | POA: Diagnosis not present

## 2019-12-22 DIAGNOSIS — O3680X0 Pregnancy with inconclusive fetal viability, not applicable or unspecified: Secondary | ICD-10-CM

## 2019-12-22 DIAGNOSIS — Z90721 Acquired absence of ovaries, unilateral: Secondary | ICD-10-CM | POA: Diagnosis not present

## 2019-12-22 DIAGNOSIS — R102 Pelvic and perineal pain: Secondary | ICD-10-CM

## 2019-12-22 DIAGNOSIS — R1032 Left lower quadrant pain: Secondary | ICD-10-CM

## 2019-12-22 DIAGNOSIS — O26899 Other specified pregnancy related conditions, unspecified trimester: Secondary | ICD-10-CM

## 2019-12-22 DIAGNOSIS — O26891 Other specified pregnancy related conditions, first trimester: Secondary | ICD-10-CM | POA: Insufficient documentation

## 2019-12-22 DIAGNOSIS — R109 Unspecified abdominal pain: Secondary | ICD-10-CM | POA: Diagnosis present

## 2019-12-22 LAB — URINALYSIS, ROUTINE W REFLEX MICROSCOPIC
Bilirubin Urine: NEGATIVE
Glucose, UA: NEGATIVE mg/dL
Hgb urine dipstick: NEGATIVE
Ketones, ur: NEGATIVE mg/dL
Nitrite: NEGATIVE
Protein, ur: NEGATIVE mg/dL
Specific Gravity, Urine: 1.016 (ref 1.005–1.030)
pH: 5 (ref 5.0–8.0)

## 2019-12-22 LAB — CBC
HCT: 39.7 % (ref 36.0–46.0)
Hemoglobin: 13.2 g/dL (ref 12.0–15.0)
MCH: 29.3 pg (ref 26.0–34.0)
MCHC: 33.2 g/dL (ref 30.0–36.0)
MCV: 88 fL (ref 80.0–100.0)
Platelets: 246 10*3/uL (ref 150–400)
RBC: 4.51 MIL/uL (ref 3.87–5.11)
RDW: 13 % (ref 11.5–15.5)
WBC: 9.9 10*3/uL (ref 4.0–10.5)
nRBC: 0 % (ref 0.0–0.2)

## 2019-12-22 LAB — WET PREP, GENITAL
Clue Cells Wet Prep HPF POC: NONE SEEN
Sperm: NONE SEEN
Trich, Wet Prep: NONE SEEN
Yeast Wet Prep HPF POC: NONE SEEN

## 2019-12-22 LAB — POCT PREGNANCY, URINE: Preg Test, Ur: POSITIVE — AB

## 2019-12-22 NOTE — MAU Note (Addendum)
Having minor cramping earlier today. Called office and told to monitor pain and if got worse to go to hospital. About an hour ago pain got really worse. Pain is sharp and in lower abd. And constant. Intensifies at times. No vag bleeding or d/c. LMP 11/11/19. About 10days ago had hand/foot/mouth disease but ok now

## 2019-12-22 NOTE — MAU Provider Note (Signed)
Chief Complaint: Abdominal Pain   First Provider Initiated Contact with Patient 12/22/19 2241        SUBJECTIVE HPI: Amanda Hicks is a 24 y.o. G2P1001 at [redacted]w[redacted]d by LMP who presents to maternity admissions reporting pain in left lower abdomen today.  At one point it was severe.  Now about a "6".  No bleeding. She denies vaginal bleeding, vaginal itching/burning, urinary symptoms, h/a, dizziness, n/v, or fever/chills.    Abdominal Pain This is a new problem. The current episode started today. The problem occurs constantly. The problem has been waxing and waning. The pain is located in the LLQ. The quality of the pain is cramping and sharp. The abdominal pain does not radiate. Pertinent negatives include no constipation, diarrhea, dysuria, fever, frequency, headaches, nausea or vomiting. Nothing aggravates the pain. The pain is relieved by nothing. She has tried nothing for the symptoms. Her past medical history is significant for abdominal surgery (Left Oopherectomy due to torsion).   RN Note: Having minor cramping earlier today. Called office and told to monitor pain and if got worse to go to hospital. About an hour ago pain got really worse. Pain is sharp and in lower abd. And constant. Intensifies at times. No vag bleeding or d/c. LMP 11/11/19. About 10days ago had hand/foot/mouth disease but ok now  Past Medical History:  Diagnosis Date   Dizziness    GERD (gastroesophageal reflux disease)    Hyperemesis affecting pregnancy, antepartum    Low TSH level    Nasal congestion related to pregnancy    Obesity in pregnancy    PCOS (polycystic ovarian syndrome)    Polycystic ovaries    Tachycardia    Tonsillitis 07/2012   URI, acute    Vomiting blood    Past Surgical History:  Procedure Laterality Date   OOPHORECTOMY Left 02/2012   TONSILLECTOMY N/A 08/24/2012   Procedure: TONSILLECTOMY and ADENOIDECTOMY;  Surgeon: Drema Halon, MD;  Location: [facility];   Service: ENT;  Laterality: N/A;   Social History   Socioeconomic History   Marital status: Married    Spouse name: Not on file   Number of children: Not on file   Years of education: Not on file   Highest education level: Not on file  Occupational History   Not on file  Tobacco Use   Smoking status: Never Smoker   Smokeless tobacco: Never Used  Vaping Use   Vaping Use: Never used  Substance and Sexual Activity   Alcohol use: No   Drug use: No   Sexual activity: Yes  Other Topics Concern   Not on file  Social History Narrative   Not on file   Social Determinants of Health   Financial Resource Strain:    Difficulty of Paying Living Expenses: Not on file  Food Insecurity:    Worried About Running Out of Food in the Last Year: Not on file   Ran Out of Food in the Last Year: Not on file  Transportation Needs:    Lack of Transportation (Medical): Not on file   Lack of Transportation (Non-Medical): Not on file  Physical Activity:    Days of Exercise per Week: Not on file   Minutes of Exercise per Session: Not on file  Stress:    Feeling of Stress : Not on file  Social Connections:    Frequency of Communication with Friends and Family: Not on file   Frequency of Social Gatherings with Friends and Family: Not  on file   Attends Religious Services: Not on file   Active Member of Clubs or Organizations: Not on file   Attends BankerClub or Organization Meetings: Not on file   Marital Status: Not on file  Intimate Partner Violence:    Fear of Current or Ex-Partner: Not on file   Emotionally Abused: Not on file   Physically Abused: Not on file   Sexually Abused: Not on file   No current facility-administered medications on file prior to encounter.   Current Outpatient Medications on File Prior to Encounter  Medication Sig Dispense Refill   Prenatal Vit-Fe Fumarate-FA (MULTIVITAMIN-PRENATAL) 27-0.8 MG TABS tablet Take 1 tablet by mouth daily at 12  noon.     acetaminophen (TYLENOL) 325 MG tablet Take 2 tablets (650 mg total) by mouth every 4 (four) hours as needed (for pain scale < 4  OR  temperature  >/=  100.5 F).     ibuprofen (ADVIL,MOTRIN) 600 MG tablet Take 1 tablet (600 mg total) by mouth every 6 (six) hours. 30 tablet 0   No Known Allergies  I have reviewed patient's Past Medical Hx, Surgical Hx, Family Hx, Social Hx, medications and allergies.   ROS:  Review of Systems  Constitutional: Negative for fever.  Gastrointestinal: Positive for abdominal pain. Negative for constipation, diarrhea, nausea and vomiting.  Genitourinary: Negative for dysuria and frequency.  Neurological: Negative for headaches.   Review of Systems  Other systems negative   Physical Exam  Physical Exam Patient Vitals for the past 24 hrs:  BP Temp Pulse Height Weight  12/22/19 2231 130/77 -- 86 -- --  12/22/19 2210 129/84 98.5 F (36.9 C) 85 5\' 4"  (1.626 m) 124.3 kg   Constitutional: Well-developed, well-nourished female in no acute distress.  Cardiovascular: normal rate Respiratory: normal effort GI: Abd soft, non-tender. Pos BS x 4 MS: Extremities nontender, no edema, normal ROM Neurologic: Alert and oriented x 4.  GU: Neg CVAT.  PELVIC EXAM: Cervix pink, visually closed, without lesion, scant white creamy discharge, vaginal walls and external genitalia normal Bimanual exam: Cervix 0/long/high, firm, anterior, neg CMT, uterus nontender, nonenlarged, adnexa without tenderness, enlargement, or mass on right, moderatedly tender on left.  Exam is limited by habitus   LAB RESULTS Results for orders placed or performed during the hospital encounter of 12/22/19 (from the past 24 hour(s))  Urinalysis, Routine w reflex microscopic     Status: Abnormal   Collection Time: 12/22/19  9:56 PM  Result Value Ref Range   Color, Urine YELLOW YELLOW   APPearance HAZY (A) CLEAR   Specific Gravity, Urine 1.016 1.005 - 1.030   pH 5.0 5.0 - 8.0    Glucose, UA NEGATIVE NEGATIVE mg/dL   Hgb urine dipstick NEGATIVE NEGATIVE   Bilirubin Urine NEGATIVE NEGATIVE   Ketones, ur NEGATIVE NEGATIVE mg/dL   Protein, ur NEGATIVE NEGATIVE mg/dL   Nitrite NEGATIVE NEGATIVE   Leukocytes,Ua TRACE (A) NEGATIVE   RBC / HPF 0-5 0 - 5 RBC/hpf   WBC, UA 0-5 0 - 5 WBC/hpf   Bacteria, UA RARE (A) NONE SEEN   Squamous Epithelial / LPF 0-5 0 - 5   Mucus PRESENT   Pregnancy, urine POC     Status: Abnormal   Collection Time: 12/22/19 10:01 PM  Result Value Ref Range   Preg Test, Ur POSITIVE (A) NEGATIVE  CBC     Status: None   Collection Time: 12/22/19 11:06 PM  Result Value Ref Range   WBC 9.9 4.0 -  10.5 K/uL   RBC 4.51 3.87 - 5.11 MIL/uL   Hemoglobin 13.2 12.0 - 15.0 g/dL   HCT 40.9 36 - 46 %   MCV 88.0 80.0 - 100.0 fL   MCH 29.3 26.0 - 34.0 pg   MCHC 33.2 30.0 - 36.0 g/dL   RDW 81.1 91.4 - 78.2 %   Platelets 246 150 - 400 K/uL   nRBC 0.0 0.0 - 0.2 %  hCG, quantitative, pregnancy     Status: Abnormal   Collection Time: 12/22/19 11:06 PM  Result Value Ref Range   hCG, Beta Chain, Quant, S 7,288 (H) <5 mIU/mL  Wet prep, genital     Status: Abnormal   Collection Time: 12/22/19 11:19 PM   Specimen: Vaginal  Result Value Ref Range   Yeast Wet Prep HPF POC NONE SEEN NONE SEEN   Trich, Wet Prep NONE SEEN NONE SEEN   Clue Cells Wet Prep HPF POC NONE SEEN NONE SEEN   WBC, Wet Prep HPF POC MANY (A) NONE SEEN   Sperm NONE SEEN        IMAGING US OB Comp Less 14 Wks  Result Date: 12/22/2019 CLINICAL DATA:  Abdominal pain. EXAM: OBSTETRIC <14 WK ULTRASOUND TECHNIQUE: Transabdominal ultrasound was performed for evaluation of the gestation as well as the maternal uterus and adnexal regions. COMPARISON:  None. FINDINGS: Intrauterine gestational sac: Single, within the superior aspect of the fundus on the left Yolk sac:  Visualized. Embryo:  Not Visualized. Cardiac Activity: Not Visualized. Heart Rate: N/A bpm MSD:  11.3 mm   5 w   6 d Subchorionic  hemorrhage:  None visualized. Maternal uterus/adnexae: The right ovary is visualized and is normal in appearance. The left ovary is surgically absent. No pelvic free fluid is seen. IMPRESSION: Single intrauterine gestational sac and yolk sac, at approximately 5 weeks and 6 days gestation by ultrasound evaluation, without evidence of a fetal pole. While this may be secondary to early intrauterine pregnancy, correlation with follow-up pelvic ultrasound is recommended. Electronically Signed   By: Aram Candela M.D.   On: 12/22/2019 23:45   US OB Transvaginal  Result Date: 12/22/2019 CLINICAL DATA:  Abdominal pain. EXAM: OBSTETRIC <14 WK [place] AND TRANSVAGINAL OB US TECHNIQUE: Both transabdominal and transvaginal ultrasound examinations were performed for complete evaluation of the gestation as well as the maternal uterus, adnexal regions, and pelvic cul-de-sac. Transvaginal technique was performed to assess early pregnancy. COMPARISON:  None. FINDINGS: Intrauterine gestational sac: Single, within the superior aspect of the fundus on the left Yolk sac:  Visualized. Embryo:  Not Visualized. Cardiac Activity: Not Visualized. Heart Rate: N/A bpm MSD: 11.3 mm   5 w   6 d Subchorionic hemorrhage:  None visualized. Maternal uterus/adnexae: The right ovary is visualized and is normal in appearance. The left ovary is surgically absent. No pelvic free fluid is seen. IMPRESSION: Single intrauterine gestational sac and yolk sac, at approximately 5 weeks and 6 days gestation by ultrasound evaluation, without evidence of a fetal pole. While this may be secondary to early intrauterine pregnancy, correlation with follow-up pelvic ultrasound is recommended. Electronically Signed   By: Aram Candela M.D.   On: 12/22/2019 23:45     MAU Management/MDM: Ordered usual first trimester r/o ectopic labs.   Pelvic exam and cultures done Will check baseline Ultrasound to rule out ectopic.  Consult Dr Jolayne Panther with  presentation, exam findings, and results.  .   This bleeding/pain can represent a normal pregnancy with bleeding, spontaneous abortion or  even an ectopic which can be life-threatening.  The process as listed above helps to determine which of these is present.  Updated Dr Conni Elliot for followup in office.  Discussed results with patient and her.  Since pain has been stronger at times, we will plan close followup in office.  Dr Conni Elliot will arrange for followup in office, probably before her existing appointment.  Patient will return if her pain gets significantly worse  ASSESSMENT Single intrauterine pregnancy at [redacted]w[redacted]d Left lower abdominal pain History of left Oopherectomy  PLAN Discharge home Tylenol for pain Followup in office for reassessment  Pt stable at time of discharge. Encouraged to return here if she develops worsening of symptoms, increase in pain, fever, or other concerning symptoms.    Wynelle Bourgeois CNM, MSN Certified Nurse-Midwife 12/22/2019  10:41 PM

## 2019-12-23 DIAGNOSIS — Z3A01 Less than 8 weeks gestation of pregnancy: Secondary | ICD-10-CM

## 2019-12-23 DIAGNOSIS — O26891 Other specified pregnancy related conditions, first trimester: Secondary | ICD-10-CM | POA: Diagnosis not present

## 2019-12-23 DIAGNOSIS — Z90721 Acquired absence of ovaries, unilateral: Secondary | ICD-10-CM | POA: Diagnosis not present

## 2019-12-23 DIAGNOSIS — R1032 Left lower quadrant pain: Secondary | ICD-10-CM

## 2019-12-23 LAB — GC/CHLAMYDIA PROBE AMP (~~LOC~~) NOT AT ARMC
Chlamydia: NEGATIVE
Comment: NEGATIVE
Comment: NORMAL
Neisseria Gonorrhea: NEGATIVE

## 2019-12-23 LAB — HIV ANTIBODY (ROUTINE TESTING W REFLEX): HIV Screen 4th Generation wRfx: NONREACTIVE

## 2019-12-23 LAB — HCG, QUANTITATIVE, PREGNANCY: hCG, Beta Chain, Quant, S: 7288 m[IU]/mL — ABNORMAL HIGH (ref <5)

## 2019-12-23 NOTE — Discharge Instructions (Signed)
First Trimester of Pregnancy The first trimester of pregnancy is from week 1 until the end of week 13 (months 1 through 3). A week after a sperm fertilizes an egg, the egg will implant on the wall of the uterus. This embryo will begin to develop into a baby. Genes from you and your partner will form the baby. The female genes will determine whether the baby will be a boy or a girl. At 6-8 weeks, the eyes and face will be formed, and the heartbeat can be seen on ultrasound. At the end of 12 weeks, all the baby's organs will be formed. Now that you are pregnant, you will want to do everything you can to have a healthy baby. Two of the most important things are to get good prenatal care and to follow your health care provider's instructions. Prenatal care is all the medical care you receive before the baby's birth. This care will help prevent, find, and treat any problems during the pregnancy and childbirth. Body changes during your first trimester Your body goes through many changes during pregnancy. The changes vary from woman to woman.  You may gain or lose a couple of pounds at first.  You may feel sick to your stomach (nauseous) and you may throw up (vomit). If the vomiting is uncontrollable, call your health care provider.  You may tire easily.  You may develop headaches that can be relieved by medicines. All medicines should be approved by your health care provider.  You may urinate more often. Painful urination may mean you have a bladder infection.  You may develop heartburn as a result of your pregnancy.  You may develop constipation because certain hormones are causing the muscles that push stool through your intestines to slow down.  You may develop hemorrhoids or swollen veins (varicose veins).  Your breasts may begin to grow larger and become tender. Your nipples may stick out more, and the tissue that surrounds them (areola) may become darker.  Your gums may bleed and may be  sensitive to brushing and flossing.  Dark spots or blotches (chloasma, mask of pregnancy) may develop on your face. This will likely fade after the baby is born.  Your menstrual periods will stop.  You may have a loss of appetite.  You may develop cravings for certain kinds of food.  You may have changes in your emotions from day to day, such as being excited to be pregnant or being concerned that something may go wrong with the pregnancy and baby.  You may have more vivid and strange dreams.  You may have changes in your hair. These can include thickening of your hair, rapid growth, and changes in texture. Some women also have hair loss during or after pregnancy, or hair that feels dry or thin. Your hair will most likely return to normal after your baby is born. What to expect at prenatal visits During a routine prenatal visit:  You will be weighed to make sure you and the baby are growing normally.  Your blood pressure will be taken.  Your abdomen will be measured to track your baby's growth.  The fetal heartbeat will be listened to between weeks 10 and 14 of your pregnancy.  Test results from any previous visits will be discussed. Your health care provider may ask you:  How you are feeling.  If you are feeling the baby move.  If you have had any abnormal symptoms, such as leaking fluid, bleeding, severe headaches, or abdominal   cramping.  If you are using any tobacco products, including cigarettes, chewing tobacco, and electronic cigarettes.  If you have any questions. Other tests that may be performed during your first trimester include:  Blood tests to find your blood type and to check for the presence of any previous infections. The tests will also be used to check for low iron levels (anemia) and protein on red blood cells (Rh antibodies). Depending on your risk factors, or if you previously had diabetes during pregnancy, you may have tests to check for high blood sugar  that affects pregnant women (gestational diabetes).  Urine tests to check for infections, diabetes, or protein in the urine.  An ultrasound to confirm the proper growth and development of the baby.  Fetal screens for spinal cord problems (spina bifida) and Down syndrome.  HIV (human immunodeficiency virus) testing. Routine prenatal testing includes screening for HIV, unless you choose not to have this test.  You may need other tests to make sure you and the baby are doing well. Follow these instructions at home: Medicines  Follow your health care provider's instructions regarding medicine use. Specific medicines may be either safe or unsafe to take during pregnancy.  Take a prenatal vitamin that contains at least 600 micrograms (mcg) of folic acid.  If you develop constipation, try taking a stool softener if your health care provider approves. Eating and drinking   Eat a balanced diet that includes fresh fruits and vegetables, whole grains, good sources of protein such as meat, eggs, or tofu, and low-fat dairy. Your health care provider will help you determine the amount of weight gain that is right for you.  Avoid raw meat and uncooked cheese. These carry germs that can cause birth defects in the baby.  Eating four or five small meals rather than three large meals a day may help relieve nausea and vomiting. If you start to feel nauseous, eating a few soda crackers can be helpful. Drinking liquids between meals, instead of during meals, also seems to help ease nausea and vomiting.  Limit foods that are high in fat and processed sugars, such as fried and sweet foods.  To prevent constipation: ? Eat foods that are high in fiber, such as fresh fruits and vegetables, whole grains, and beans. ? Drink enough fluid to keep your urine clear or pale yellow. Activity  Exercise only as directed by your health care provider. Most women can continue their usual exercise routine during  pregnancy. Try to exercise for 30 minutes at least 5 days a week. Exercising will help you: ? Control your weight. ? Stay in shape. ? Be prepared for labor and delivery.  Experiencing pain or cramping in the lower abdomen or lower back is a good sign that you should stop exercising. Check with your health care provider before continuing with normal exercises.  Try to avoid standing for long periods of time. Move your legs often if you must stand in one place for a long time.  Avoid heavy lifting.  Wear low-heeled shoes and practice good posture.  You may continue to have sex unless your health care provider tells you not to. Relieving pain and discomfort  Wear a good support bra to relieve breast tenderness.  Take warm sitz baths to soothe any pain or discomfort caused by hemorrhoids. Use hemorrhoid cream if your health care provider approves.  Rest with your legs elevated if you have leg cramps or low back pain.  If you develop varicose veins in   your legs, wear support hose. Elevate your feet for 15 minutes, 3-4 times a day. Limit salt in your diet. Prenatal care  Schedule your prenatal visits by the twelfth week of pregnancy. They are usually scheduled monthly at first, then more often in the last 2 months before delivery.  Write down your questions. Take them to your prenatal visits.  Keep all your prenatal visits as told by your health care provider. This is important. Safety  Wear your seat belt at all times when driving.  Make a list of emergency phone numbers, including numbers for family, friends, the hospital, and police and fire departments. General instructions  Ask your health care provider for a referral to a local prenatal education class. Begin classes no later than the beginning of month 6 of your pregnancy.  Ask for help if you have counseling or nutritional needs during pregnancy. Your health care provider can offer advice or refer you to specialists for help  with various needs.  Do not use hot tubs, steam rooms, or saunas.  Do not douche or use tampons or scented sanitary pads.  Do not cross your legs for long periods of time.  Avoid cat litter boxes and soil used by cats. These carry germs that can cause birth defects in the baby and possibly loss of the fetus by miscarriage or stillbirth.  Avoid all smoking, herbs, alcohol, and medicines not prescribed by your health care provider. Chemicals in these products affect the formation and growth of the baby.  Do not use any products that contain nicotine or tobacco, such as cigarettes and e-cigarettes. If you need help quitting, ask your health care provider. You may receive counseling support and other resources to help you quit.  Schedule a dentist appointment. At home, brush your teeth with a soft toothbrush and be gentle when you floss. Contact a health care provider if:  You have dizziness.  You have mild pelvic cramps, pelvic pressure, or nagging pain in the abdominal area.  You have persistent nausea, vomiting, or diarrhea.  You have a bad smelling vaginal discharge.  You have pain when you urinate.  You notice increased swelling in your face, hands, legs, or ankles.  You are exposed to fifth disease or chickenpox.  You are exposed to German measles (rubella) and have never had it. Get help right away if:  You have a fever.  You are leaking fluid from your vagina.  You have spotting or bleeding from your vagina.  You have severe abdominal cramping or pain.  You have rapid weight gain or loss.  You vomit blood or material that looks like coffee grounds.  You develop a severe headache.  You have shortness of breath.  You have any kind of trauma, such as from a fall or a car accident. Summary  The first trimester of pregnancy is from week 1 until the end of week 13 (months 1 through 3).  Your body goes through many changes during pregnancy. The changes vary from  woman to woman.  You will have routine prenatal visits. During those visits, your health care provider will examine you, discuss any test results you may have, and talk with you about how you are feeling. This information is not intended to replace advice given to you by your health care provider. Make sure you discuss any questions you have with your health care provider. Document Revised: 01/23/2017 Document Reviewed: 01/23/2016 Elsevier Patient Education  2020 [employer]  

## 2022-07-16 IMAGING — US US OB COMP LESS 14 WK
1 series · 15 of 28 positions shown · non-contrast
Comparison: None.

CLINICAL DATA: Abdominal pain.

EXAM:
OBSTETRIC <14 WK ULTRASOUND
TECHNIQUE: Transabdominal ultrasound was performed for evaluation of the
gestation as well as the maternal uterus and adnexal regions.

[Series 1: us ob comp less 14 wk · 49 acquisitions, 15 frames shown]
[im 1/49]
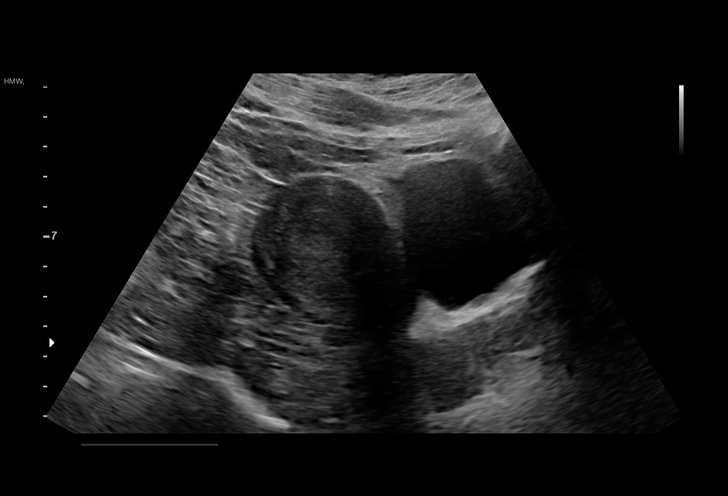
[im 4/49]
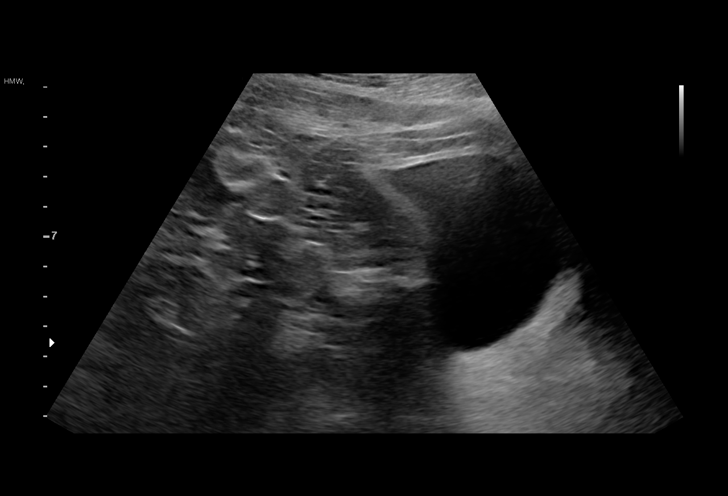
[im 8/49]
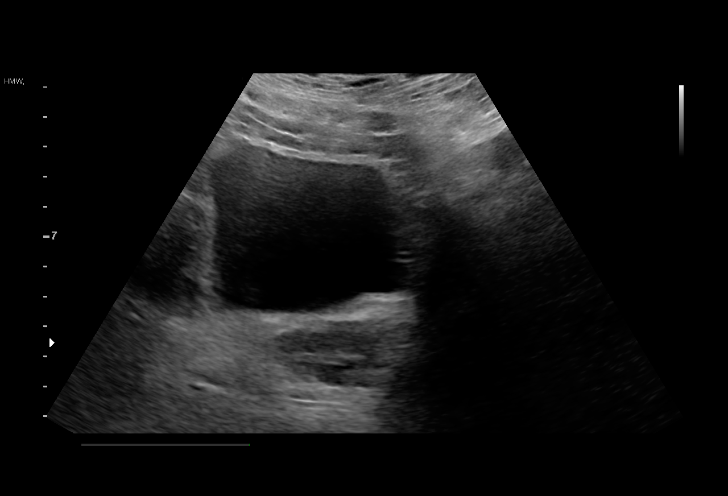
[im 11/49]
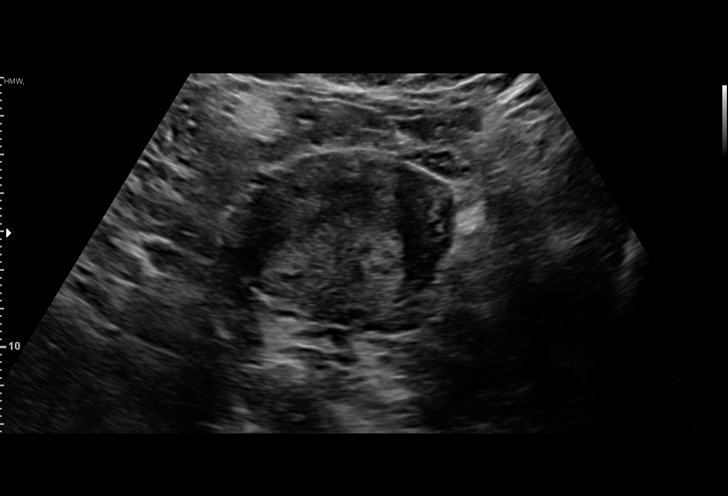
[im 15/49]
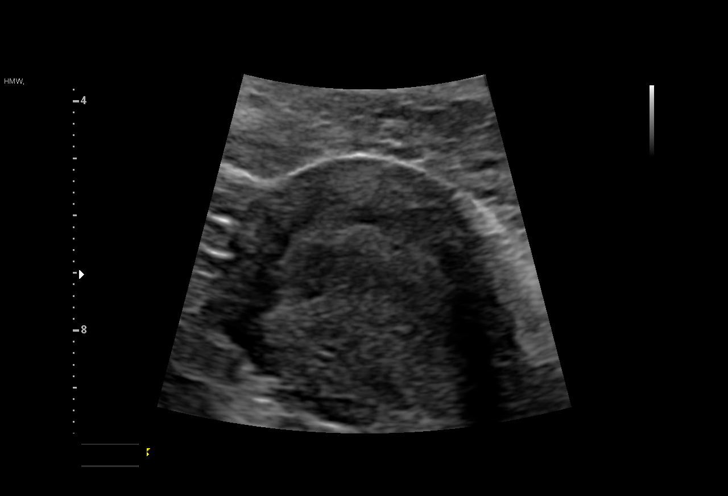
[im 18/49]
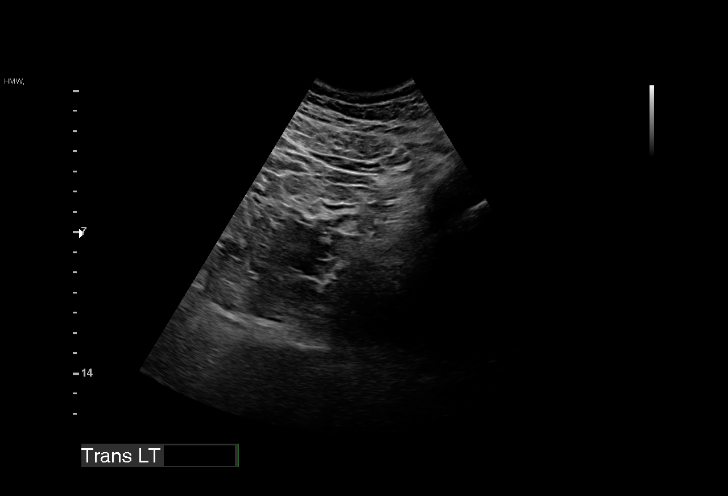
[im 22/49]
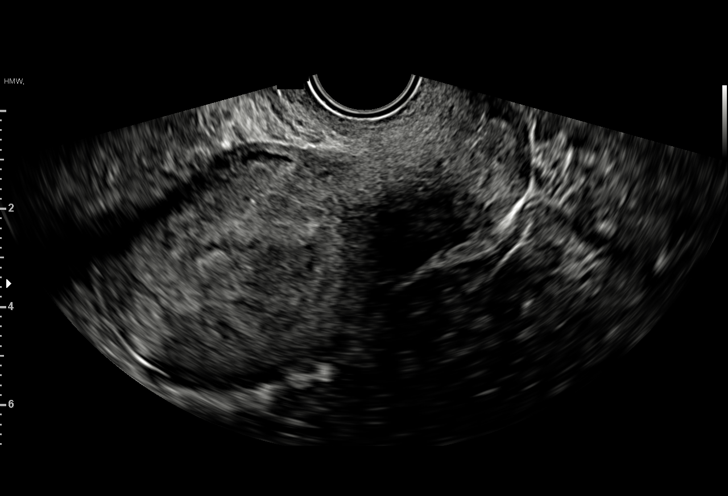
[im 25/49]
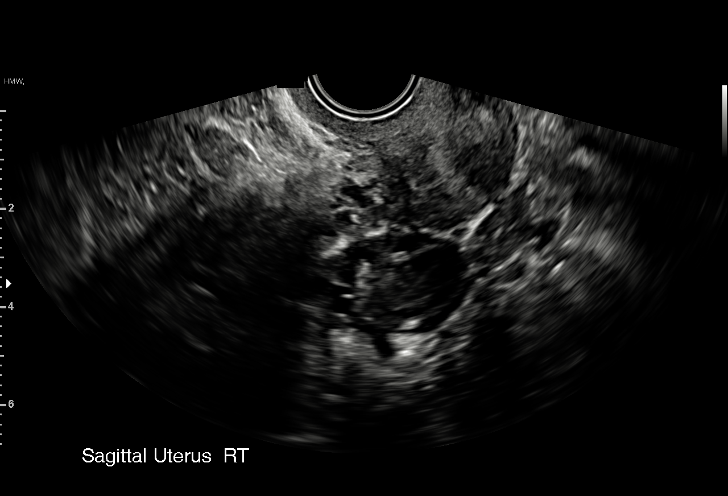
[im 27/49]
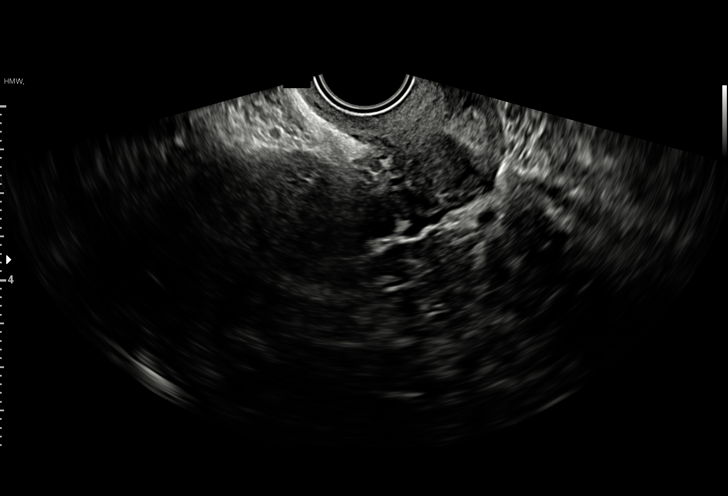
[im 31/49]
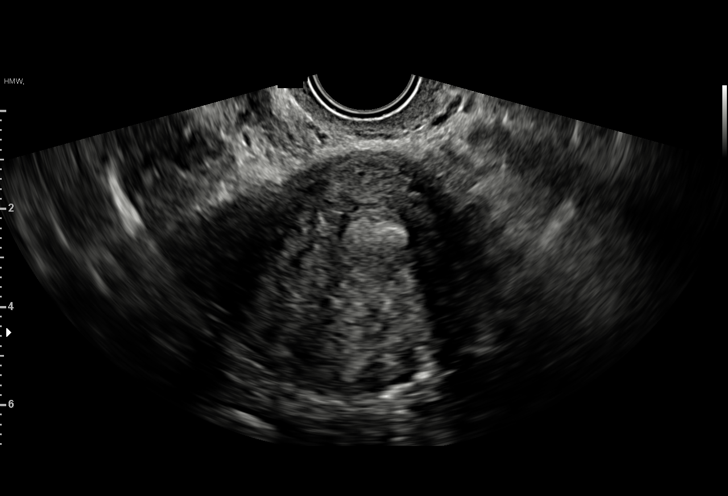
[im 34/49]
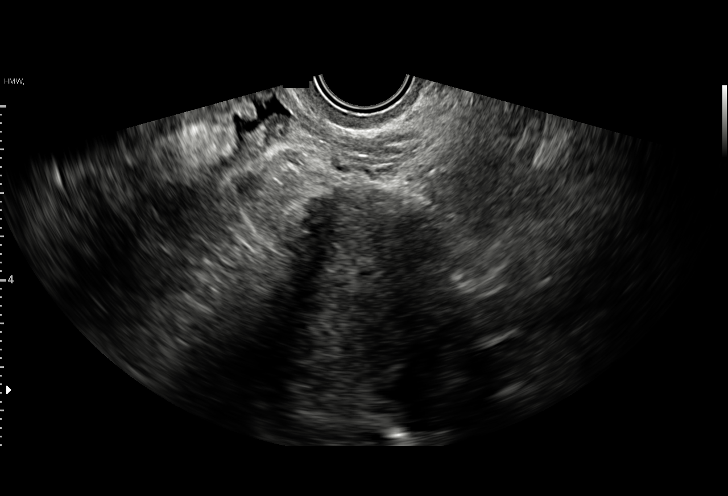
[im 38/49]
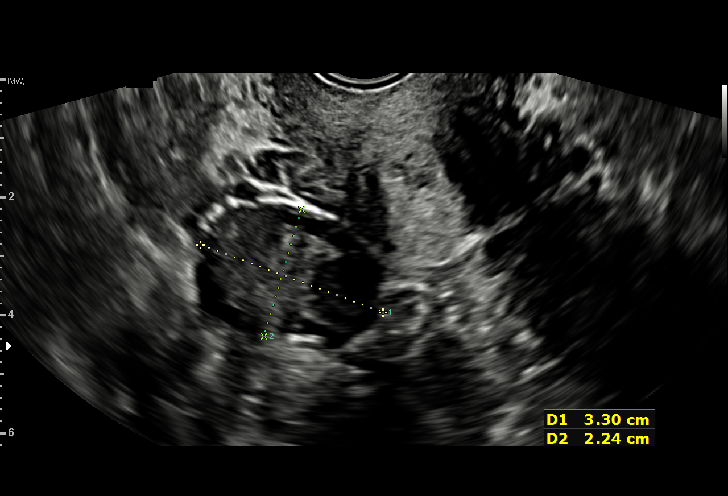
[im 41/49]
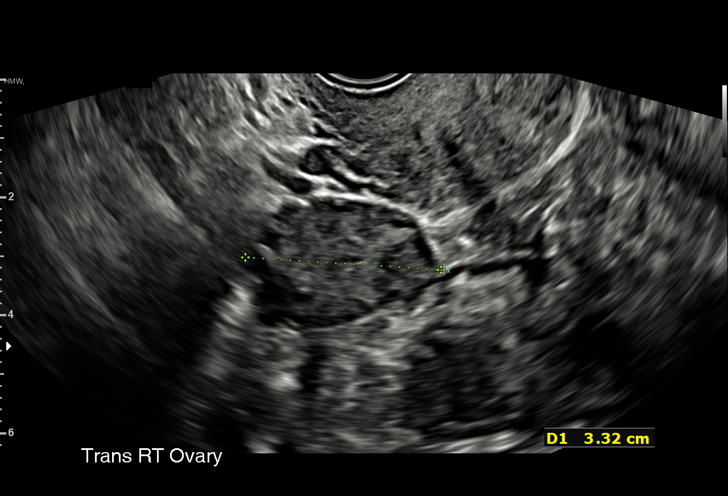
[im 45/49]
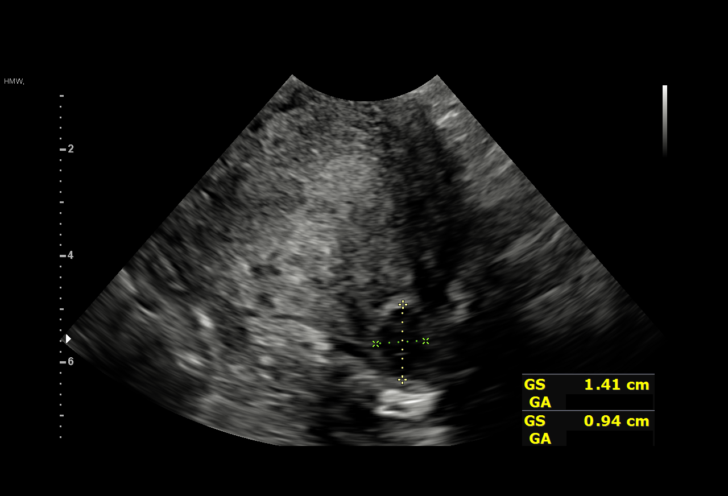
[im 49/49]
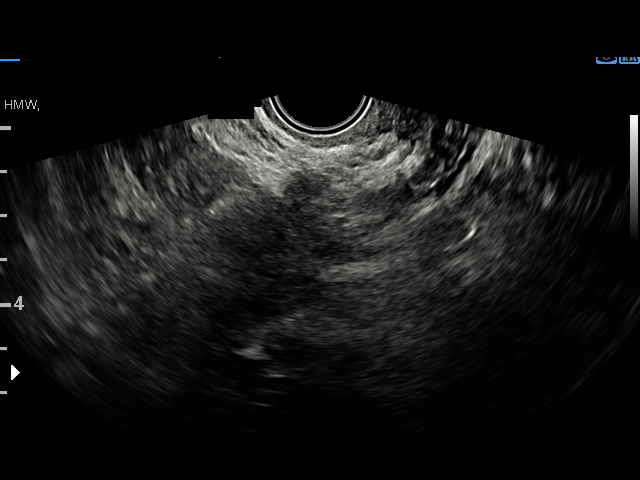

[15 of 28 positions shown; findings below may reference images not displayed]

FINDINGS: Intrauterine gestational sac: Single, within the superior aspect of
the fundus on the left

Yolk sac:  Visualized.

Embryo:  Not Visualized.

Cardiac Activity: Not Visualized.

Heart Rate: N/A bpm

MSD:  11.3 mm   5 w   6 d

Subchorionic hemorrhage:  None visualized.

Maternal uterus/adnexae: The right ovary is visualized and is normal
in appearance.

The left ovary is surgically absent.

No pelvic free fluid is seen.
IMPRESSION: Single intrauterine gestational sac and yolk sac, at approximately 5
weeks and 6 days gestation by ultrasound evaluation, without
evidence of a fetal pole. While this may be secondary to early
intrauterine pregnancy, correlation with follow-up pelvic ultrasound
is recommended.

## 2022-10-08 ENCOUNTER — Other Ambulatory Visit: Payer: Self-pay

## 2022-10-08 ENCOUNTER — Ambulatory Visit
Admission: EM | Admit: 2022-10-08 | Discharge: 2022-10-08 | Disposition: A | Discharge status: Home / Self Care | Payer: 59 | Attending: Family Medicine | Admitting: Family Medicine

## 2022-10-08 DIAGNOSIS — R1013 Epigastric pain: Secondary | ICD-10-CM | POA: Diagnosis not present

## 2022-10-08 DIAGNOSIS — K219 Gastro-esophageal reflux disease without esophagitis: Secondary | ICD-10-CM | POA: Diagnosis not present

## 2022-10-08 MED ORDER — OMEPRAZOLE 20 MG PO CPDR: 20.0000 mg | DELAYED_RELEASE_CAPSULE | Freq: Every day | ORAL | 0 refills | Status: AC

## 2022-10-08 MED ORDER — ALUM & MAG HYDROXIDE-SIMETH 200-200-20 MG/5ML PO SUSP
30.0000 mL | Freq: Once | ORAL | Status: AC
  Administered 2022-10-08: 30 mL via ORAL

## 2022-10-08 NOTE — ED Triage Notes (Signed)
C/o epigastric abd pain x 2 days, nausea. Denies v/d.

## 2022-10-08 NOTE — Discharge Instructions (Addendum)
Advised patient to take medication as directed.  Advised patient to take medication on empty stomach with 12 ounces of water and no other medication or foods for 45 minutes in order for proton pump inhibitor to work properly.  Advised patient may establish care with [place] [facility] care at [facility].  Contact information provided with this AVS.  Additionally, GI referral contact information provided with his AVS today.  Advised patient if symptoms worsen and cannot get in with new PCP or here please go to [facility] ED for further evaluation.

## 2022-10-08 NOTE — ED Provider Notes (Signed)
[facility]    CSN: [id number] Arrival date & time: 10/08/22  1354      History   Chief Complaint No chief complaint on file.   HPI Amanda Hicks is a 27 y.o. female.   HPI 27 year old female presents with epigastric abdominal pain for 2 days.  Endorses nausea denies vomiting and diarrhea.  PMH significant for morbid obesity, GERD, and PCOS.  Past Medical History:  Diagnosis Date   Dizziness    GERD (gastroesophageal reflux disease)    Hyperemesis affecting pregnancy, antepartum    Low TSH level    Nasal congestion related to pregnancy    Obesity in pregnancy    PCOS (polycystic ovarian syndrome)    Polycystic ovaries    Tachycardia    Tonsillitis 07/2012   URI, acute    Vomiting blood     Patient Active Problem List   Diagnosis Date Noted   Encounter for planned induction of labor 07/14/2017   SVD (spontaneous vaginal delivery) 07/14/2017   Postpartum care following vaginal delivery (5/21) 07/14/2017   Preterm labor 06/16/2017   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   PCOS (polycystic ovarian syndrome) 03/04/2016   Insomnia 10/25/2010   Morbid obesity with BMI of 45.0-49.9, adult (HCC) 10/25/2010    Past Surgical History:  Procedure Laterality Date   OOPHORECTOMY Left 02/2012   TONSILLECTOMY N/A 08/24/2012   Procedure: TONSILLECTOMY and ADENOIDECTOMY;  Surgeon: Drema Halon, MD;  Location: [facility];  Service: ENT;  Laterality: N/A;    OB History     Gravida  2   Para  1   Term  1   Preterm      AB      Living  1      SAB      IAB      Ectopic      Multiple  0   Live Births  1            Home Medications    Prior to Admission medications   Medication Sig Start Date End Date Taking? Authorizing Provider  omeprazole (PRILOSEC) 20 MG capsule Take 1 capsule (20 mg total) by mouth daily. 10/08/22 11/07/22 Yes Trevor Iha, FNP    Family History Family History   Problem Relation Age of Onset   Hypertension Mother    Diabetes Mellitus I Father     Social History Social History   Tobacco Use   Smoking status: Never   Smokeless tobacco: Never  Vaping Use   Vaping status: Never Used  Substance Use Topics   Alcohol use: No   Drug use: No     Allergies   Patient has no known allergies.   Review of Systems Review of Systems  Gastrointestinal:  Positive for abdominal pain.       Epigastric abdominal pain for 2 days     Physical Exam Triage Vital Signs ED Triage Vitals  Encounter Vitals Group     BP 10/08/22 1402 125/80     Systolic BP Percentile --      Diastolic BP Percentile --      Pulse Rate 10/08/22 1402 81     Resp 10/08/22 1402 16     Temp 10/08/22 1402 98.2 F (36.8 C)     Temp Source 10/08/22 1402 Oral     SpO2 10/08/22 1402 98 %     Weight --      Height --  Head Circumference --      Peak Flow --      Pain Score 10/08/22 1403 7     Pain Loc --      Pain Education --      Exclude from Growth Chart --    No data found.  Updated Vital Signs BP 125/80 (BP Location: Left Arm)   Pulse 81   Temp 98.2 F (36.8 C) (Oral)   Resp 16   SpO2 98%    Physical Exam Vitals and nursing note reviewed.  Constitutional:      General: She is not in acute distress.    Appearance: Normal appearance. She is obese. She is not ill-appearing, toxic-appearing or diaphoretic.  HENT:     Head: Normocephalic and atraumatic.     Nose: Nose normal.     Mouth/Throat:     Mouth: Mucous membranes are moist.     Pharynx: Oropharynx is clear.  Eyes:     General:        Right eye: No discharge.     Extraocular Movements: Extraocular movements intact.     Conjunctiva/sclera: Conjunctivae normal.     Pupils: Pupils are equal, round, and reactive to light.  Cardiovascular:     Rate and Rhythm: Normal rate and regular rhythm.     Pulses: Normal pulses.     Heart sounds: Normal heart sounds.  Pulmonary:     Effort: Pulmonary  effort is normal.     Breath sounds: Normal breath sounds. No wheezing, rhonchi or rales.  Musculoskeletal:        General: Normal range of motion.     Cervical back: Normal range of motion and neck supple.  Skin:    General: Skin is warm and dry.  Neurological:     General: No focal deficit present.     Mental Status: She is alert and oriented to person, place, and time. Mental status is at baseline.  Psychiatric:        Mood and Affect: Mood normal.        Behavior: Behavior normal.        Thought Content: Thought content normal.      UC Treatments / Results  Labs (all labs ordered are listed, but only abnormal results are displayed) Labs Reviewed - No data to display  EKG   Radiology No results found.  Procedures Procedures (including critical care time)  Medications Ordered in UC Medications  alum & mag hydroxide-simeth (MAALOX/MYLANTA) 200-200-20 MG/5ML suspension 30 mL (30 mLs Oral Given 10/08/22 1445)    Initial Impression / Assessment and Plan / UC Course  I have reviewed the triage vital signs and the nursing notes.  Pertinent labs & imaging results that were available during my care of the patient were reviewed by me and considered in my medical decision making (see chart for details).     MDM: 1.  Abdominal pain, epigastric (GI cocktail given once in clinic prior to discharge) patient reports no change prior to taking GI cocktail; 2.  Gastroesophageal reflux disease, unspecified whether esophagitis present-Rx'd omeprazole 20 mg capsule x 30 days with specific instructions on how to take this medication properly provided to patient prior to discharge. Advised patient to take medication as directed.  Advised patient to take medication on empty stomach with 12 ounces of water and no other medication or foods for 45 minutes in order for proton pump inhibitor to work properly.  Advised patient may establish care with [place] primary  care at [facility].   Contact information provided with this AVS.  Additionally, GI referral contact information provided with his AVS today.  Advised patient if symptoms worsen and cannot get in with new PCP or here please go to [facility] ED for further evaluation. Final Clinical Impressions(s) / UC Diagnoses   Final diagnoses:  Abdominal pain, epigastric  Gastroesophageal reflux disease, unspecified whether esophagitis present     Discharge Instructions      Advised patient to take medication as directed.  Advised patient to take medication on empty stomach with 12 ounces of water and no other medication or foods for 45 minutes in order for proton pump inhibitor to work properly.  Advised patient may establish care with [place] primary care at [facility].  Contact information provided with this AVS.  Additionally, GI referral contact information provided with his AVS today.  Advised patient if symptoms worsen and cannot get in with new PCP or here please go to [facility] ED for further evaluation.     ED Prescriptions     Medication Sig Dispense Auth. Provider   omeprazole (PRILOSEC) 20 MG capsule Take 1 capsule (20 mg total) by mouth daily. 30 capsule Trevor Iha, FNP      PDMP not reviewed this encounter.   Trevor Iha, FNP 10/08/22 1545
